# Patient Record
Sex: Female | Born: 1965 | Race: White | Hispanic: No | Marital: Married | Smoking: Current every day smoker
Health system: Southern US, Community
[De-identification: ages and names within clinical notes are randomized; demographics above are authoritative.]

## PROBLEM LIST (undated history)

## (undated) DIAGNOSIS — I10 Essential (primary) hypertension: Secondary | ICD-10-CM

## (undated) DIAGNOSIS — Z9109 Other allergy status, other than to drugs and biological substances: Secondary | ICD-10-CM

## (undated) DIAGNOSIS — Z8739 Personal history of other diseases of the musculoskeletal system and connective tissue: Secondary | ICD-10-CM

## (undated) DIAGNOSIS — S329XXA Fracture of unspecified parts of lumbosacral spine and pelvis, initial encounter for closed fracture: Secondary | ICD-10-CM

## (undated) DIAGNOSIS — N6002 Solitary cyst of left breast: Secondary | ICD-10-CM

## (undated) DIAGNOSIS — Z9289 Personal history of other medical treatment: Secondary | ICD-10-CM

## (undated) DIAGNOSIS — D649 Anemia, unspecified: Secondary | ICD-10-CM

## (undated) HISTORY — DX: Essential (primary) hypertension: I10

## (undated) HISTORY — DX: Fracture of unspecified parts of lumbosacral spine and pelvis, initial encounter for closed fracture: S32.9XXA

## (undated) HISTORY — PX: NECK SURGERY: SHX720

## (undated) HISTORY — PX: CERVIX LESION DESTRUCTION: SHX591

## (undated) HISTORY — DX: Personal history of other diseases of the musculoskeletal system and connective tissue: Z87.39

## (undated) HISTORY — DX: Solitary cyst of left breast: N60.02

## (undated) HISTORY — DX: Personal history of other medical treatment: Z92.89

## (undated) HISTORY — DX: Other allergy status, other than to drugs and biological substances: Z91.09

---

## 1986-02-06 DIAGNOSIS — S329XXA Fracture of unspecified parts of lumbosacral spine and pelvis, initial encounter for closed fracture: Secondary | ICD-10-CM

## 1986-02-06 HISTORY — PX: DIAGNOSTIC LAPAROSCOPY: SUR761

## 1986-02-06 HISTORY — DX: Fracture of unspecified parts of lumbosacral spine and pelvis, initial encounter for closed fracture: S32.9XXA

## 1991-02-07 HISTORY — PX: CHOLECYSTECTOMY: SHX55

## 2000-02-07 DIAGNOSIS — N6002 Solitary cyst of left breast: Secondary | ICD-10-CM

## 2000-02-07 HISTORY — DX: Solitary cyst of left breast: N60.02

## 2008-03-18 ENCOUNTER — Ambulatory Visit: Payer: Self-pay | Admitting: Internal Medicine

## 2008-06-03 ENCOUNTER — Ambulatory Visit: Payer: Self-pay | Admitting: Internal Medicine

## 2009-05-07 HISTORY — PX: FUNCTIONAL ENDOSCOPIC SINUS SURGERY: SUR616

## 2009-05-07 HISTORY — PX: NASAL SEPTUM SURGERY: SHX37

## 2009-05-13 ENCOUNTER — Ambulatory Visit: Payer: Self-pay | Admitting: Otolaryngology

## 2009-10-31 ENCOUNTER — Ambulatory Visit: Payer: Self-pay | Admitting: Family Medicine

## 2010-02-23 ENCOUNTER — Ambulatory Visit: Payer: Self-pay | Admitting: Family Medicine

## 2010-02-28 ENCOUNTER — Ambulatory Visit: Payer: Self-pay | Admitting: Internal Medicine

## 2010-03-01 ENCOUNTER — Ambulatory Visit: Payer: Self-pay | Admitting: Family Medicine

## 2010-04-07 ENCOUNTER — Ambulatory Visit: Payer: Self-pay

## 2011-12-16 LAB — URINALYSIS, COMPLETE
Bilirubin,UR: NEGATIVE
Blood: NEGATIVE
Glucose,UR: NEGATIVE mg/dL (ref 0–75)
Ketone: NEGATIVE
Leukocyte Esterase: NEGATIVE
Nitrite: NEGATIVE
Ph: 6 (ref 4.5–8.0)
Protein: NEGATIVE
RBC,UR: 1 /HPF (ref 0–5)
Specific Gravity: 1.003 (ref 1.003–1.030)
Squamous Epithelial: 2
WBC UR: NONE SEEN /HPF (ref 0–5)

## 2011-12-16 LAB — CBC
HCT: 43.9 % (ref 35.0–47.0)
HGB: 15.2 g/dL (ref 12.0–16.0)
MCH: 32.1 pg (ref 26.0–34.0)
MCHC: 34.7 g/dL (ref 32.0–36.0)
MCV: 93 fL (ref 80–100)
Platelet: 219 10*3/uL (ref 150–440)
RBC: 4.74 10*6/uL (ref 3.80–5.20)
RDW: 13.7 % (ref 11.5–14.5)
WBC: 10.5 10*3/uL (ref 3.6–11.0)

## 2011-12-16 LAB — COMPREHENSIVE METABOLIC PANEL
Albumin: 4.1 g/dL (ref 3.4–5.0)
Alkaline Phosphatase: 77 U/L (ref 50–136)
Anion Gap: 11 (ref 7–16)
BUN: 8 mg/dL (ref 7–18)
Bilirubin,Total: 0.2 mg/dL (ref 0.2–1.0)
Calcium, Total: 9 mg/dL (ref 8.5–10.1)
Chloride: 111 mmol/L — ABNORMAL HIGH (ref 98–107)
Co2: 22 mmol/L (ref 21–32)
Creatinine: 0.67 mg/dL (ref 0.60–1.30)
EGFR (African American): >60
EGFR (Non-African Amer.): >60
Glucose: 86 mg/dL (ref 65–99)
Osmolality: 284 (ref 275–301)
Potassium: 3.4 mmol/L — ABNORMAL LOW (ref 3.5–5.1)
SGOT(AST): 20 U/L (ref 15–37)
SGPT (ALT): 22 U/L (ref 12–78)
Sodium: 144 mmol/L (ref 136–145)
Total Protein: 7.1 g/dL (ref 6.4–8.2)

## 2011-12-16 LAB — LIPASE, BLOOD: Lipase: 410 U/L — ABNORMAL HIGH (ref 73–393)

## 2011-12-17 ENCOUNTER — Observation Stay: Payer: Self-pay | Admitting: Internal Medicine

## 2011-12-17 LAB — ETHANOL
Ethanol %: 0.085 % — ABNORMAL HIGH (ref 0.000–0.080)
Ethanol: 85 mg/dL

## 2011-12-17 LAB — LIPID PANEL
Cholesterol: 155 mg/dL (ref 0–200)
HDL Cholesterol: 28 mg/dL — ABNORMAL LOW (ref 40–60)
Ldl Cholesterol, Calc: 66 mg/dL (ref 0–100)
Triglycerides: 307 mg/dL — ABNORMAL HIGH (ref 0–200)
VLDL Cholesterol, Calc: 61 mg/dL — ABNORMAL HIGH (ref 5–40)

## 2011-12-17 LAB — TROPONIN I
Troponin-I: <0.02 ng/mL
Troponin-I: <0.02 ng/mL
Troponin-I: <0.02 ng/mL

## 2011-12-17 LAB — DRUG SCREEN, URINE

## 2011-12-17 LAB — CK TOTAL AND CKMB (NOT AT ARMC)
CK, Total: 94 U/L (ref 21–215)
CK, Total: 71 U/L (ref 21–215)
CK, Total: 61 U/L (ref 21–215)
CK-MB: 1.1 ng/mL (ref 0.5–3.6)
CK-MB: 1.1 ng/mL (ref 0.5–3.6)
CK-MB: 0.9 ng/mL (ref 0.5–3.6)

## 2011-12-18 LAB — URINE CULTURE

## 2011-12-22 LAB — CULTURE, BLOOD (SINGLE)

## 2013-02-07 ENCOUNTER — Ambulatory Visit: Payer: Self-pay | Admitting: Physician Assistant

## 2013-02-07 LAB — RAPID STREP-A WITH REFLX: Micro Text Report: NEGATIVE

## 2013-02-07 LAB — RAPID INFLUENZA A&B ANTIGENS

## 2013-02-10 LAB — BETA STREP CULTURE(ARMC)

## 2013-11-11 ENCOUNTER — Ambulatory Visit: Payer: Self-pay | Admitting: Emergency Medicine

## 2013-11-11 LAB — RAPID STREP-A WITH REFLX: Micro Text Report: NEGATIVE

## 2013-11-14 LAB — BETA STREP CULTURE(ARMC)

## 2014-05-26 NOTE — H&P (Signed)
Past Med/Surgical Hx:  Denies medical history:   Denies surgical history.:   ALLERGIES:  No Known Allergies:     Assessment/Admission Diagnosis 49 yo female with PMHx of tobacco abuse now with elevate lipase with epigastric and LLQ Pain  Abdominal pain - DDx includes pancreatitis, nephrolithasis, UTI - patient with elevated lipase (410) - states she is a social drinker (1-2 beer/wk), will still check EtOH level - check urine culture, blood culture, urine drug screen - check CT A\P - GI consult - pain management with morphine  - IVFs - supportive care - IV PPI  Tobacco Abuse: counselled pt on tobacco cessation  Cough - chronic, no increase sputum production - secondary to long history of tobacco use, will continue to monitor  Hypokalemia - mild - secondary to poor po intake - will replace and recheck  FULL CODE PMD - Dr. Gavin PottersGrandis ([facility])  Time spent evaluating patient = 45 minutes [id number]#[id number]   Electronic Signatures: Stephanie AcreMungal, Ranya Fiddler (MD)  (Signed [phone]-Nov-13 04:40)  Authored: PAST MEDICAL/SURGIAL HISTORY, ALLERGIES, HOME MEDICATIONS, ASSESSMENT AND PLAN   Last Updated: 10-Nov-13 04:40 by Stephanie AcreMungal, Irving Lubbers (MD)

## 2014-05-26 NOTE — Discharge Summary (Signed)
PATIENT NAME:  Nicole Carpenter, Nicole Carpenter MR#:  [id number] DATE OF BIRTH:  [birth year]  DATE OF ADMISSION:  12/17/2011 DATE OF DISCHARGE:  12/17/2011  PRIMARY CARE PHYSICIAN: Barry BrunnerGlenn Willett, MD  DISCHARGE DIAGNOSES:  1. Mild acute pancreatitis, possible alcohol-induced gastritis.  2. Alcohol abuse.  3. Tobacco abuse.   CONDITION: Stable.   HOME MEDICATIONS:  1. Multivitamin 1 tablet p.o. daily.  2. Protonix 40 mg p.o. daily.   DIET: Regular.   ACTIVITY: As tolerated.   FOLLOWUP CARE: Follow-up with PCP within 1 to 2 weeks. Smoking cessation and alcohol cessation - was counseled.  REASON FOR ADMISSION: Abdominal pain for one week.   HOSPITAL COURSE: The patient is a 49 year old Caucasian female with no past medical history, only with tobacco abuse, who presented to the ED with abdominal pain for one week which was intermittent, sharp. The patient was noted to have mild elevation of lipase at 410 and was admitted for acute pancreatitis. After admission the patient has been treated with n.p.o. and IV fluid support with PPI. CT scan of the abdomen and ultrasound of abdomen did not show any cholelithiasis or nephrolithiasis, no obvious obstruction. Chest x-ray showed no acute disease. CBC was normal. Glucose was 86, BUN 8, and creatinine 0.67. Potassium was 3.4, which was treated with supplement. After admission the patient had no abdominal pain, no nausea, no vomiting or diarrhea. Workup so far is negative. We started regular diet. If the patient can tolerate, the patient will be discharged to home today. I discussed the patient's discharge plan with the patient and case manager.   TIME SPENT: About 34 minutes. ____________________________ Shaune PollackQing Kolbi Tofte, MD qc:slb D: 12/17/2011 14:14:21 ET T: 12/18/2011 12:18:59 ET JOB#: [id number]  cc: Shaune PollackQing Naveh Rickles, MD, <Dictator> Jorje GuildGlenn R. Beckey DowningWillett, MD Shaune PollackQING Evey Mcmahan MD ELECTRONICALLY SIGNED 12/19/2011 16:37

## 2014-05-26 NOTE — H&P (Signed)
PATIENT NAME:  Nicole Carpenter, Blessin L MR#:  [id number] DATE OF BIRTH:  07/01/1965  DATE OF ADMISSION:  12/17/2011  ED REFERRING PHYSICIAN: Dr. Zenda AlpersWebster   PR[facility] C[facility]IC[facility]   ADMITTING PHYSICIAN: Dr. Dema SeverinMungal   CHIEF COMPLAINT: Abdominal pain x1 week, getting worse.   HISTORY OF PRESENT ILLNESS: This is a pleasant 49 year old Caucasian female with past medical history significant only for tobacco abuse who presented with the above chief complaint. The patient stated that last week she started having some abdominal pain in the epigastric and left lower quadrant region, more of a spasm-like, lasting a few seconds, however, as the week progressed the frequency of the pain continued to increase and become sharp and throbbing at times. It is not positional. It is not associated with anything. She denies any trauma, scorpion stings, bites, spider bites, or anything to that area. The patient stated she drinks 1 to 2 beers and she has never had pancreatitis in the past before. She does state that when she had her son 20 years ago she did have nephrolithiasis at that time but has not had it since. The patient thinks that the pain is similar to when she had nephrolithiasis but she is unsure. She was [facility] yesterday, was given some sort of muscle relaxer which she cannot remember the name of but thinks it's cyclobenzaprine. As stated, that may have helped with the pain mildly. She said when the pain comes on it's about 8 to 10, currently it's a 3 out of 10 right now. She denies any nausea, vomiting, fever, diarrhea, burning with urination, or trauma to the abdomen. Hospitalist services were consulted when her lipase was noted to be 410 and she was still having pain. The patient will be admitted for further inpatient work-up and management.   PAST MEDICAL HISTORY: Tobacco abuse.  MEDICATIONS: Cyclobenzaprine. The patient will bring an updated medication list.    PAST SURGICAL HISTORY:  1. Cholecystectomy 20 years ago. 2. Reconstruction of deviated septum 20 years ago.   ALLERGIES: Seasonal; no known drug allergies.   FAMILY HISTORY: Mother has history of hypertension.   SOCIAL HISTORY: Smoker, a pack a day for 30 years. Drinks occasionally one to two beers a week.   REVIEW OF SYSTEMS: CONSTITUTIONAL: Denies fever, fatigue, weakness. Positive abdominal pain. No weight loss, weight gain. No night sweats. EYES: No blurred vision, double vision, pain, or redness. ENT: No tinnitus, ear pain, hearing loss. RESPIRATORY: Chronic cough. No wheezing, hemoptysis, dyspnea, asthma, painful respirations, TB, or pneumonia. CARDIOVASCULAR: No chest pain, edema, arrhythmia, dyspnea on exertion. GI: No nausea, vomiting, diarrhea. Positive midepigastric and left lower quadrant pain. No melena, ulcers, GERD. GU: No dysuria, hematuria, or renal calculi. ENDOCRINE: No polyuria, nocturia, or thyroid problems. HEME/LYMPH: No anemia, easy bruising, bleeding, or swollen glands. INTEGUMENTARY: No acne, rash, lesions, or change in mole, hair or skin. MUSCULOSKELETAL: No pain in neck, back, shoulder, knee, hips, or arthritis. NEUROLOGIC: No numbness, weakness, dysarthria, tremor, vertigo, ataxia. PSYCH: No anxiety, insomnia, ADD, OCD, bipolar, depression, or nervousness.   PHYSICAL EXAMINATION:   VITAL SIGNS IN THE ED: Temperature 97.5, pulse 91, respirations 22, blood pressure 129/79, oxygen sat 98% on room air.   GENERAL APPEARANCE: Well developed, well nourished female laying in bed in no acute respiratory distress.   HEENT: Pupils equal, round, and reactive to light and accommodation. Extraocular movements intact. No scleral icterus. No difficulty hearing. TMs are intact. No pharyngeal erythema.  Mucous membranes are pale.   NECK: No thyroid enlargement or nodules. Neck is supple and nontender. No JVD or carotid bruits.   RESPIRATORY: Clear to auscultation bilaterally. No  rales, rhonchi, crackles, or diminished breath sounds or wheezing. No labored breathing.   CARDIOVASCULAR: Regular rate, regular rhythm. No murmurs. S1, S2 auscultated. PMI lateralization. No extremity edema. Good radial and dorsalis pedis pulses.   ABDOMEN: Soft. No masses palpated. Positive bowel sounds. Mild epigastric pain to moderate palpation. No guarding. No rebound tenderness. Moderate left lower quadrant pain to deep palpation.   MUSCULOSKELETAL: 5 out of 5 strength in bilateral upper and lower extremities.   SKIN: No rash is noted especially in the abdominal region.   SKIN: Warm and dry.   LYMPHATIC: No adenopathy noted in cervical, axilla, or supraclavicular regions.   NEURO: Cranial nerves II through XII intact. No other deficits noted.   PSYCH: Alert and oriented to time and place. Cooperative. Good judgment.   PERTINENT LABS: Sodium 144, potassium 3.4, chloride 111, bicarb 22, BUN 8, creatinine 0.67, glucose 86, AST 20, ALT 22, alkaline phosphatase 77, total bilirubin 0.2. First set of troponin is less than 0.02. CBC white cell count 10.5, hemoglobin 15.2, hematocrit 43.9, platelet count 219. Urinalysis essentially negative. Trace bacteria is the only thing noticed there.   EKG pending.   She did have an ultrasound of the abdomen by oral report is currently negative with no acute findings.   CT abdomen and pelvis is currently pending.   ASSESSMENT AND PLAN: This is a 49 year old female with past medical history of tobacco abuse now with elevated lipase, epigastric and lower quadrant pain.  1. Abdominal pain. Differential diagnoses include pancreatitis, nephrolithiasis, urinary tract infection. The patient does have an elevated lipase and does have some epigastric pain making the diagnosis of pancreatitis more likely, however, this will be a very mild course of pancreatitis if it is. She is a social drinker, 1 to 2 beers per week. Will still check an alcohol level. Check urine  culture, blood culture, and a urine drug screen. Will also check a CT abdomen and pelvis to rule out any other pathology that could be causing this. Will consult GI in the morning. Pain management with morphine 2 to 4 mg q.2 to 4 hours. also, make n.p.o. Start her on IV fluids, supportive care. Start on IV PPI.  2. Tobacco abuse. The patient was counseled on tobacco cessation and counseled on the complications of prolonged smoking given her history.  3. Cough. This is chronic. No increase in sputum production or shortness of breath secondary to prolonged history of tobacco abuse. Will continue to monitor the patient. Offered nicotine patch also.  4. Hypokalemia, is currently mild. Potassium is 3.4 secondary to poor p.o. intake and hydration. Will replace and recheck. Also give IV fluids.   CODE STATUS: The patient is a FULL CODE.   TIME SPENT DICTATING AND EVALUATING PATIENT: 45 minutes.   ____________________________ Stephanie Acre, MD vm:drc D: 12/17/2011 04:40:56 ET T: 12/17/2011 10:43:39 ET JOB#: [id number]  cc: Stephanie Acre, MD, <Dictator> Letitia Caul, MD Stephanie Acre MD ELECTRONICALLY SIGNED 12/19/2011 15:48

## 2014-06-09 LAB — HM PAP SMEAR

## 2014-07-07 LAB — HM MAMMOGRAPHY

## 2015-11-26 ENCOUNTER — Other Ambulatory Visit: Payer: Self-pay | Admitting: Family Medicine

## 2015-11-26 DIAGNOSIS — Z1231 Encounter for screening mammogram for malignant neoplasm of breast: Secondary | ICD-10-CM

## 2015-12-09 ENCOUNTER — Encounter: Payer: Self-pay | Admitting: Radiology

## 2015-12-09 ENCOUNTER — Ambulatory Visit
Admission: RE | Admit: 2015-12-09 | Discharge: 2015-12-09 | Disposition: A | Discharge status: Home / Self Care | Payer: BLUE CROSS/BLUE SHIELD | Source: Ambulatory Visit | Attending: Family Medicine | Admitting: Family Medicine

## 2015-12-09 DIAGNOSIS — Z1231 Encounter for screening mammogram for malignant neoplasm of breast: Secondary | ICD-10-CM | POA: Diagnosis present

## 2015-12-15 ENCOUNTER — Ambulatory Visit
Admission: EM | Admit: 2015-12-15 | Discharge: 2015-12-15 | Disposition: A | Discharge status: Home / Self Care | Payer: BLUE CROSS/BLUE SHIELD | Attending: Family Medicine | Admitting: Family Medicine

## 2015-12-15 DIAGNOSIS — J019 Acute sinusitis, unspecified: Secondary | ICD-10-CM

## 2015-12-15 DIAGNOSIS — J029 Acute pharyngitis, unspecified: Secondary | ICD-10-CM | POA: Diagnosis not present

## 2015-12-15 LAB — RAPID STREP SCREEN (MED CTR MEBANE ONLY): Streptococcus, Group A Screen (Direct): NEGATIVE

## 2015-12-15 MED ORDER — AMOXICILLIN-POT CLAVULANATE 875-125 MG PO TABS: 1.0000 | ORAL_TABLET | Freq: Two times a day (BID) | ORAL | 0 refills | Status: DC

## 2015-12-15 MED ORDER — CETIRIZINE-PSEUDOEPHEDRINE ER 5-120 MG PO TB12: 1.0000 | ORAL_TABLET | Freq: Two times a day (BID) | ORAL | 0 refills | Status: AC

## 2015-12-15 MED ORDER — FLUTICASONE PROPIONATE 50 MCG/ACT NA SUSP: 2.0000 | Freq: Every day | NASAL | 0 refills | Status: AC

## 2015-12-15 NOTE — ED Provider Notes (Signed)
[facility]    CSN: [id number] Arrival date & time: 12/15/15  [id number]     History   Chief Complaint Chief Complaint  Patient presents with  . Sore Throat    HPI Nicole Carpenter is a 50 y.o. female.   Patient is a 50 year old white female who presents with nasal congestion coughing and nasal pressure for over a week. She's aching since had a sore throat and does flex he can shake this. She states about once or twice a year she'll get this. The throat stasis sore which thinks is coming from postnasal drainage. Unfortunately she does smoke. She's had a cholecystectomy nasal septum surgery and neck surgery. History of breast cancer in paternal father's side. No known drug allergies.   The history is provided by the patient. No language interpreter was used.  Sore Throat  This is a new problem. The problem occurs constantly. The problem has been gradually worsening. Pertinent negatives include no chest pain, no abdominal pain, no headaches and no shortness of breath. Nothing relieves the symptoms. Treatments tried: antihistamines.    History reviewed. No pertinent past medical history.  There are no active problems to display for this patient.   Past Surgical History:  Procedure Laterality Date  . CHOLECYSTECTOMY    . NASAL SEPTUM SURGERY    . NECK SURGERY      OB History    No data available       Home Medications    Prior to Admission medications   Not on File    Family History Family History  Problem Relation Age of Onset  . Breast cancer Paternal Aunt     Social History Social History  Substance Use Topics  . Smoking status: Current Every Day Smoker    Packs/day: 1.00    Types: Cigarettes  . Smokeless tobacco: Never Used  . Alcohol use Yes     Comment: occasionally     Allergies   Patient has no known allergies.   Review of Systems Review of Systems  Respiratory: Negative for shortness of breath.   Cardiovascular: Negative for chest  pain.  Gastrointestinal: Negative for abdominal pain.  Neurological: Negative for headaches.  All other systems reviewed and are negative.    Physical Exam Triage Vital Signs ED Triage Vitals  Enc Vitals Group     BP 12/15/15 0841 (!) 136/91     Pulse Rate 12/15/15 0841 99     Resp 12/15/15 0841 16     Temp 12/15/15 0841 98.2 F (36.8 C)     Temp Source 12/15/15 0841 Oral     SpO2 12/15/15 0841 98 %     Weight 12/15/15 0840 143 lb (64.9 kg)     Height 12/15/15 0840 5\' 5"  (1.651 m)     Head Circumference --      Peak Flow --      Pain Score 12/15/15 0843 5     Pain Loc --      Pain Edu? --      Excl. in GC? --    No data found.   Updated Vital Signs BP (!) 136/91 (BP Location: Left Arm)   Pulse 99   Temp 98.2 F (36.8 C) (Oral)   Resp 16   Ht 5\' 5"  (1.651 m)   Wt 143 lb (64.9 kg)   LMP 12/09/2015   SpO2 98%   BMI 23.80 kg/m   Visual Acuity Right Eye Distance:   Left Eye Distance:  Bilateral Distance:    Right Eye Near:   Left Eye Near:    Bilateral Near:     Physical Exam  Constitutional: She is oriented to person, place, and time. She appears well-developed and well-nourished.  HENT:  Head: Normocephalic.  Nose: Mucosal edema and rhinorrhea present.  Mouth/Throat: Uvula is midline, oropharynx is clear and moist and mucous membranes are normal. No oral lesions. No dental caries.  Eyes: Pupils are equal, round, and reactive to light.  Neck: Normal range of motion. Neck supple.  Cardiovascular: Normal rate.   Pulmonary/Chest: Effort normal.  Musculoskeletal: Normal range of motion.  Lymphadenopathy:    She has cervical adenopathy.  Neurological: She is alert and oriented to person, place, and time.  Skin: Skin is warm.  Psychiatric: She has a normal mood and affect.  Vitals reviewed.    UC Treatments / Results  Labs (all labs ordered are listed, but only abnormal results are displayed) Labs Reviewed  RAPID STREP SCREEN (NOT AT [facility])     EKG  EKG Interpretation None       Radiology No results found.  Procedures Procedures (including critical care time)  Medications Ordered in UC Medications - No data to display   Initial Impression / Assessment and Plan / UC Course  I have reviewed the triage vital signs and the nursing notes.  Pertinent labs & imaging results that were available during my care of the patient were reviewed by me and considered in my medical decision making (see chart for details).  Clinical Course    If strep test is negative as expected we'll place on Augmentin 8 08/10/2008 days to treat a sinus infection. Switch from Zyrtec to Zyrtec-D twice a day and Flonase nasal spray 2 puffs each nostril daily. Recommend she stop smoking and follow-up PCP if not better in a week. She declined work note  Results for orders placed or performed during the hospital encounter of 12/15/15  Rapid strep screen  Result Value Ref Range   Streptococcus, Group A Screen (Direct) NEGATIVE NEGATIVE   Final Clinical Impressions(s) / UC Diagnoses   Final diagnoses:  Acute non-recurrent sinusitis, unspecified location  Pharyngitis, unspecified etiology    New Prescriptions New Prescriptions   No medications on file    Note: This dictation was prepared with Dragon dictation along with smaller phrase technology. Any transcriptional errors that result from this process are unintentional.   Hassan RowanEugene Kaleo Condrey, MD 12/15/15 [id number]

## 2015-12-15 NOTE — ED Triage Notes (Signed)
Patient complains of sore throat, drainage, chills, sinus pain and pressure x 1 week.

## 2015-12-16 ENCOUNTER — Inpatient Hospital Stay
Admission: RE | Admit: 2015-12-16 | Discharge: 2015-12-16 | Disposition: A | Discharge status: Home / Self Care | Payer: Self-pay | Source: Ambulatory Visit | Attending: *Deleted | Admitting: *Deleted

## 2015-12-16 ENCOUNTER — Other Ambulatory Visit: Payer: Self-pay | Admitting: *Deleted

## 2015-12-16 DIAGNOSIS — Z9289 Personal history of other medical treatment: Secondary | ICD-10-CM

## 2015-12-18 LAB — CULTURE, GROUP A STREP (THRC)

## 2015-12-20 ENCOUNTER — Telehealth: Payer: Self-pay | Admitting: *Deleted

## 2015-12-20 NOTE — Telephone Encounter (Signed)
Called patient, no answer, left message reporting a negative strep culture result. Advised patient to follow up with PCP or [facility] if symptoms persist. 

## 2016-08-31 ENCOUNTER — Other Ambulatory Visit: Payer: Self-pay | Admitting: Family Medicine

## 2016-08-31 DIAGNOSIS — N926 Irregular menstruation, unspecified: Secondary | ICD-10-CM

## 2016-09-04 ENCOUNTER — Ambulatory Visit
Admission: RE | Admit: 2016-09-04 | Discharge: 2016-09-04 | Disposition: A | Discharge status: Home / Self Care | Payer: BLUE CROSS/BLUE SHIELD | Source: Ambulatory Visit | Attending: Family Medicine | Admitting: Family Medicine

## 2016-09-04 DIAGNOSIS — N926 Irregular menstruation, unspecified: Secondary | ICD-10-CM | POA: Diagnosis not present

## 2016-09-04 DIAGNOSIS — R938 Abnormal findings on diagnostic imaging of other specified body structures: Secondary | ICD-10-CM | POA: Insufficient documentation

## 2016-09-07 ENCOUNTER — Ambulatory Visit (INDEPENDENT_AMBULATORY_CARE_PROVIDER_SITE_OTHER): Payer: BLUE CROSS/BLUE SHIELD | Admitting: Obstetrics and Gynecology

## 2016-09-07 ENCOUNTER — Encounter: Payer: Self-pay | Admitting: Obstetrics and Gynecology

## 2016-09-07 VITALS — BP 130/90 | HR 99 | Ht 65.0 in | Wt 143.0 lb

## 2016-09-07 DIAGNOSIS — N938 Other specified abnormal uterine and vaginal bleeding: Secondary | ICD-10-CM

## 2016-09-07 NOTE — Progress Notes (Signed)
Chief Complaint  Patient presents with  . Menstrual Problem    HPI:      Ms. Nicole Carpenter is a 51 y.o. [id number] who LMP was Patient's last menstrual period was 08/28/2016., presents today for  DUB sx for the past 8-9 months. Pt referred by her PCP. Menses used to be monthly, lasting 5 days. She has bleeding Q2-4 wks now, lasting 4-5 days, medium flow with small clots. She does have slightly worsening dysmen, improved with tylenol.  She denies any vasomotor sx.  She had neg thryoid labs with PCP, per pt report.  07/07/14 neg pap/Neg HPV DNA; s/p cyrotherapy for dysplasia about 2000  09/04/16 GYN u/s with PCP-->EM=6.1 MM; NO FF, BILAT OVAR WNL; 8 MM FOCI IN [place] [facility]    Past Medical History:  Diagnosis Date  . Breast cyst, left 2002   LEFT BREAST CYST RESOLVED  . Environmental allergies   . Fractured pelvis (HCC) 1988   [facility]  . History of degenerative disc disease   . History of mammogram 06/27/2013; 07/07/14   BIRADS 1; NEG  . History of Papanicolaou smear of cervix 03/29/11; 06/09/14   -/-; -/-  . Hypertension     Past Surgical History:  Procedure Laterality Date  . CERVIX LESION DESTRUCTION  1989/90  . CHOLECYSTECTOMY  1993  . DIAGNOSTIC LAPAROSCOPY  1988  . FUNCTIONAL ENDOSCOPIC SINUS SURGERY  05/2009  . NASAL SEPTUM SURGERY  05/2009  . NECK SURGERY      Family History  Problem Relation Age of Onset  . Breast cancer Paternal Aunt [id number]  . Hypertension Mother        BORDERLINE 2006  . Cancer Maternal Aunt [id number]       UTERINE    Social History   Social History  . Marital status: Married    Spouse name: N/A  . Number of children: 2  . Years of education: 14   Occupational History  . [employer] ADMIN MANAGER    Social History Main Topics  . Smoking status: Current Every Day Smoker    Packs/day: 1.00    Years: 1.00    Types: Cigarettes  . Smokeless tobacco: Never Used  . Alcohol use 1.2 oz/week    2 Cans of beer per week     Comment: 2/WK  . Drug  use: No  . Sexual activity: Yes    Birth control/ protection: Surgical     Comment: VASECTOMY   Other Topics Concern  . Not on file   Social History Narrative  . No narrative on file     Current Outpatient Prescriptions:  .  cetirizine-pseudoephedrine (ZYRTEC-D) 5-120 MG tablet, Take 1 tablet by mouth 2 (two) times daily., Disp: 30 tablet, Rfl: 0 .  fluticasone (FLONASE) 50 MCG/ACT nasal spray, Place 2 sprays into both nostrils daily., Disp: 16 g, Rfl: 0 .  amoxicillin-clavulanate (AUGMENTIN) 875-125 MG tablet, Take 1 tablet by mouth 2 (two) times daily. (Patient not taking: Reported on 09/07/2016), Disp: 20 tablet, Rfl: 0 .  metoprolol succinate (TOPROL-XL) 25 MG 24 hr tablet, , Disp: , Rfl:    ROS:  Review of Systems  Constitutional: Negative for fever.  Gastrointestinal: Negative for blood in stool, constipation, diarrhea, nausea and vomiting.  Genitourinary: Positive for menstrual problem. Negative for dyspareunia, dysuria, flank pain, frequency, hematuria, urgency, vaginal bleeding, vaginal discharge and vaginal pain.  Musculoskeletal: Negative for back pain.  Skin: Negative for rash.     OBJECTIVE:   Vitals:  BP 130/90  Pulse 99   Ht 5\' 5"  (1.651 m)   Wt 143 lb (64.9 kg)   LMP 08/28/2016   BMI 23.80 kg/m   Physical Exam  Constitutional: She is oriented to person, place, and time and well-developed, well-nourished, and in no distress. Vital signs are normal.  Genitourinary: Vagina normal, uterus normal, cervix normal, right adnexa normal, left adnexa normal and vulva normal. Uterus is not enlarged. Cervix exhibits no motion tenderness and no tenderness. Right adnexum displays no mass and no tenderness. Left adnexum displays no mass and no tenderness. Vulva exhibits no erythema, no exudate, no lesion, no rash and no tenderness. Vagina exhibits no lesion.  Neurological: She is oriented to person, place, and time.  Vitals reviewed.   Assessment/Plan: DUB  (dysfunctional uterine bleeding) - Sx for 8-9 months. Neg GYN u/s. EMB today. Will f/u with results. Discussed BC, IUD, ablation, watch and wait if results neg. - Plan: Pathology    Endometrial Biopsy After discussion with the patient regarding her abnormal uterine bleeding I recommended that she proceed with an endometrial biopsy for further diagnosis. The risks, benefits, alternatives, and indications for an endometrial biopsy were discussed with the patient in detail. She understood the risks including infection, bleeding, cervical laceration and uterine perforation.  Verbal consent was obtained.   PROCEDURE NOTE:  Pipelle endometrial biopsy was performed using aseptic technique with iodine preparation.  The uterus was sounded to a length of 8.0 cm.  Adequate sampling was obtained with minimal blood loss.  The patient tolerated the procedure well.  Disposition will be pending pathology.    Return in about 2 months (around 11/07/2016) for annual.  Alicia B. Copland, PA-C [facility], [facility] 09/07/2016  11:23 AM

## 2016-09-11 LAB — PATHOLOGY

## 2016-09-13 ENCOUNTER — Telehealth: Payer: Self-pay | Admitting: Obstetrics and Gynecology

## 2016-09-13 NOTE — Telephone Encounter (Signed)
LM for pt with neg EMB results. Most likely perimenopausal DUB sx. Can follow expectantly vs prog only methods/IUD/ablatioin. Pt to f/u at annual in 2 months/sooner prn.

## 2016-11-23 ENCOUNTER — Other Ambulatory Visit: Payer: Self-pay | Admitting: Family Medicine

## 2016-11-23 DIAGNOSIS — Z1231 Encounter for screening mammogram for malignant neoplasm of breast: Secondary | ICD-10-CM

## 2016-11-29 ENCOUNTER — Ambulatory Visit: Payer: BLUE CROSS/BLUE SHIELD | Admitting: Obstetrics and Gynecology

## 2016-12-19 ENCOUNTER — Encounter: Payer: Self-pay | Admitting: Radiology

## 2016-12-19 ENCOUNTER — Ambulatory Visit
Admission: RE | Admit: 2016-12-19 | Discharge: 2016-12-19 | Disposition: A | Discharge status: Home / Self Care | Payer: BLUE CROSS/BLUE SHIELD | Source: Ambulatory Visit | Attending: Family Medicine | Admitting: Family Medicine

## 2016-12-19 DIAGNOSIS — Z1231 Encounter for screening mammogram for malignant neoplasm of breast: Secondary | ICD-10-CM | POA: Diagnosis present

## 2017-11-19 ENCOUNTER — Other Ambulatory Visit: Payer: Self-pay | Admitting: Family Medicine

## 2017-11-19 DIAGNOSIS — Z1231 Encounter for screening mammogram for malignant neoplasm of breast: Secondary | ICD-10-CM

## 2017-12-24 ENCOUNTER — Ambulatory Visit
Admission: RE | Admit: 2017-12-24 | Discharge: 2017-12-24 | Disposition: A | Discharge status: Home / Self Care | Payer: BLUE CROSS/BLUE SHIELD | Source: Ambulatory Visit | Attending: Family Medicine | Admitting: Family Medicine

## 2017-12-24 DIAGNOSIS — Z1231 Encounter for screening mammogram for malignant neoplasm of breast: Secondary | ICD-10-CM | POA: Insufficient documentation

## 2018-10-30 ENCOUNTER — Emergency Department: Payer: BLUE CROSS/BLUE SHIELD

## 2018-10-30 ENCOUNTER — Other Ambulatory Visit: Payer: Self-pay

## 2018-10-30 ENCOUNTER — Observation Stay
Admission: EM | Admit: 2018-10-30 | Discharge: 2018-10-31 | Disposition: A | Discharge status: Home / Self Care | Payer: BLUE CROSS/BLUE SHIELD | Attending: Internal Medicine | Admitting: Internal Medicine

## 2018-10-30 ENCOUNTER — Encounter: Payer: Self-pay | Admitting: Emergency Medicine

## 2018-10-30 ENCOUNTER — Observation Stay
Admit: 2018-10-30 | Discharge: 2018-10-30 | Disposition: A | Discharge status: Home / Self Care | Payer: BLUE CROSS/BLUE SHIELD | Attending: Internal Medicine | Admitting: Internal Medicine

## 2018-10-30 DIAGNOSIS — R7989 Other specified abnormal findings of blood chemistry: Secondary | ICD-10-CM | POA: Diagnosis present

## 2018-10-30 DIAGNOSIS — I10 Essential (primary) hypertension: Secondary | ICD-10-CM | POA: Insufficient documentation

## 2018-10-30 DIAGNOSIS — I209 Angina pectoris, unspecified: Principal | ICD-10-CM

## 2018-10-30 DIAGNOSIS — Z79899 Other long term (current) drug therapy: Secondary | ICD-10-CM | POA: Diagnosis not present

## 2018-10-30 DIAGNOSIS — Z20828 Contact with and (suspected) exposure to other viral communicable diseases: Secondary | ICD-10-CM | POA: Insufficient documentation

## 2018-10-30 DIAGNOSIS — K219 Gastro-esophageal reflux disease without esophagitis: Secondary | ICD-10-CM | POA: Diagnosis not present

## 2018-10-30 DIAGNOSIS — F17210 Nicotine dependence, cigarettes, uncomplicated: Secondary | ICD-10-CM | POA: Diagnosis not present

## 2018-10-30 DIAGNOSIS — R079 Chest pain, unspecified: Secondary | ICD-10-CM | POA: Diagnosis present

## 2018-10-30 DIAGNOSIS — J45909 Unspecified asthma, uncomplicated: Secondary | ICD-10-CM | POA: Insufficient documentation

## 2018-10-30 DIAGNOSIS — R778 Other specified abnormalities of plasma proteins: Secondary | ICD-10-CM

## 2018-10-30 LAB — BASIC METABOLIC PANEL
Anion gap: 11 (ref 5–15)
BUN: 6 mg/dL (ref 6–20)
CO2: 27 mmol/L (ref 22–32)
Calcium: 10.4 mg/dL — ABNORMAL HIGH (ref 8.9–10.3)
Chloride: 105 mmol/L (ref 98–111)
Creatinine, Ser: 0.63 mg/dL (ref 0.44–1.00)
GFR calc Af Amer: >60 mL/min (ref >60)
GFR calc non Af Amer: >60 mL/min (ref >60)
Glucose, Bld: 97 mg/dL (ref 70–99)
Potassium: 4.3 mmol/L (ref 3.5–5.1)
Sodium: 143 mmol/L (ref 135–145)

## 2018-10-30 LAB — SARS CORONAVIRUS 2 (TAT 6-24 HRS): SARS Coronavirus 2: NEGATIVE

## 2018-10-30 LAB — CBC
HCT: 45.4 % (ref 36.0–46.0)
Hemoglobin: 14.6 g/dL (ref 12.0–15.0)
MCH: 29.1 pg (ref 26.0–34.0)
MCHC: 32.2 g/dL (ref 30.0–36.0)
MCV: 90.6 fL (ref 80.0–100.0)
Platelets: 297 10*3/uL (ref 150–400)
RBC: 5.01 MIL/uL (ref 3.87–5.11)
RDW: 14.5 % (ref 11.5–15.5)
WBC: 8.5 10*3/uL (ref 4.0–10.5)
nRBC: 0 % (ref 0.0–0.2)

## 2018-10-30 LAB — TROPONIN I (HIGH SENSITIVITY)
Troponin I (High Sensitivity): 21 ng/L — ABNORMAL HIGH (ref <18)
Troponin I (High Sensitivity): 37 ng/L — ABNORMAL HIGH (ref <18)
Troponin I (High Sensitivity): 6 ng/L (ref <18)

## 2018-10-30 MED ORDER — ENOXAPARIN SODIUM 40 MG/0.4ML ~~LOC~~ SOLN
40.0000 mg | SUBCUTANEOUS | Status: DC
  Filled 2018-10-30: qty 0.4

## 2018-10-30 MED ORDER — DOCUSATE SODIUM 100 MG PO CAPS: 100.0000 mg | ORAL_CAPSULE | Freq: Two times a day (BID) | ORAL | Status: DC | PRN

## 2018-10-30 MED ORDER — OMEPRAZOLE MAGNESIUM 20 MG PO TBEC: 20.0000 mg | DELAYED_RELEASE_TABLET | Freq: Every day | ORAL | Status: DC

## 2018-10-30 MED ORDER — PANTOPRAZOLE SODIUM 40 MG PO TBEC
40.0000 mg | DELAYED_RELEASE_TABLET | Freq: Every day | ORAL | Status: DC
  Administered 2018-10-31: 40 mg via ORAL
  Filled 2018-10-30: qty 1

## 2018-10-30 MED ORDER — FLUTICASONE PROPIONATE 50 MCG/ACT NA SUSP
2.0000 | Freq: Every day | NASAL | Status: DC
  Administered 2018-10-31: 2 via NASAL
  Filled 2018-10-30: qty 16

## 2018-10-30 MED ORDER — METOPROLOL SUCCINATE ER 25 MG PO TB24
12.5000 mg | ORAL_TABLET | Freq: Every day | ORAL | Status: DC
  Administered 2018-10-31: 13:00:00 12.5 mg via ORAL
  Filled 2018-10-30: qty 1

## 2018-10-30 NOTE — H&P (Signed)
[employer] - [facility]   PATIENT NAME: Nicole Carpenter    MR#:  [id number]  DATE OF BIRTH:  04/08/1965  DATE OF ADMISSION:  10/30/2018  PRIMARY CARE PHYSICIAN: Rolm Gala, MD   REQUESTING/REFERRING PHYSICIAN: [email or web address]  CHIEF COMPLAINT:   Chief Complaint  Patient presents with  . Chest Pain    HISTORY OF PRESENT ILLNESS: Nicole Carpenter  is a 53 y.o. female with a known history of degenerative disc disease, hypertension, cholecystectomy-had intermittent episodes of chest discomfort in the past.  Today morning she developed substernal chest pain which was pressure-like in nature was going all across her chest but not to her arms.  It lasted around 2 hours and was not associated with any fever palpitation or cough.  Patient troponin was noted slightly elevated and so ER physician suggested to monitor on hospitalist service and call cardiologist for further management.  PAST MEDICAL HISTORY:   Past Medical History:  Diagnosis Date  . Breast cyst, left 2002   LEFT BREAST CYST RESOLVED  . Environmental allergies   . Fractured pelvis (HCC) 1988   [facility]  . History of degenerative disc disease   . History of mammogram 06/27/2013; 07/07/14   BIRADS 1; NEG  . History of Papanicolaou smear of cervix 03/29/11; 06/09/14   -/-; -/-  . Hypertension     PAST SURGICAL HISTORY:  Past Surgical History:  Procedure Laterality Date  . CERVIX LESION DESTRUCTION  1989/90  . CHOLECYSTECTOMY  1993  . DIAGNOSTIC LAPAROSCOPY  1988  . FUNCTIONAL ENDOSCOPIC SINUS SURGERY  05/2009  . NASAL SEPTUM SURGERY  05/2009  . NECK SURGERY      SOCIAL HISTORY:  Social History   Tobacco Use  . Smoking status: Current Every Day Smoker    Packs/day: 1.00    Years: 1.00    Pack years: 1.00    Types: Cigarettes  . Smokeless tobacco: Never Used  Substance Use Topics  . Alcohol use: Yes    Alcohol/week: 2.0 standard drinks    Types: 2 Cans of beer per week    FAMILY HISTORY:   Family History  Problem Relation Age of Onset  . Breast cancer Paternal Aunt 28  . Hypertension Mother        BORDERLINE 2006  . Cancer Maternal Aunt 70       UTERINE  . Breast cancer Cousin        mat cousin  . Breast cancer Sister 9    DRUG ALLERGIES: No Known Allergies  REVIEW OF SYSTEMS:   CONSTITUTIONAL: No fever, fatigue or weakness.  EYES: No blurred or double vision.  EARS, NOSE, AND THROAT: No tinnitus or ear pain.  RESPIRATORY: No cough, shortness of breath, wheezing or hemoptysis.  CARDIOVASCULAR: have chest pain, no orthopnea, edema.  GASTROINTESTINAL: No nausea, vomiting, diarrhea or abdominal pain.  GENITOURINARY: No dysuria, hematuria.  ENDOCRINE: No polyuria, nocturia,  HEMATOLOGY: No anemia, easy bruising or bleeding SKIN: No rash or lesion. MUSCULOSKELETAL: No joint pain or arthritis.   NEUROLOGIC: No tingling, numbness, weakness.  PSYCHIATRY: No anxiety or depression.   MEDICATIONS AT HOME:  Prior to Admission medications   Medication Sig Start Date End Date Taking? Authorizing Provider  metoprolol succinate (TOPROL-XL) 25 MG 24 hr tablet Take 12.5 mg by mouth daily. 07/24/18  Yes [provider]  omeprazole (PRILOSEC OTC) 20 MG tablet Take 20 mg by mouth daily.   Yes [provider]  amoxicillin-clavulanate (AUGMENTIN) 875-125 MG tablet Take 1  tablet by mouth 2 (two) times daily. Patient not taking: Reported on 09/07/2016 12/15/15   Hassan Rowan, MD  cetirizine-pseudoephedrine (ZYRTEC-D) 5-120 MG tablet Take 1 tablet by mouth 2 (two) times daily. 12/15/15   Hassan Rowan, MD  fluticasone (FLONASE) 50 MCG/ACT nasal spray Place 2 sprays into both nostrils daily. 12/15/15   Hassan Rowan, MD      PHYSICAL EXAMINATION:   VITAL SIGNS: Blood pressure 113/74, pulse 85, temperature 98.7 F (37.1 C), temperature source Oral, resp. rate 16, height 5\' 5"  (1.651 m), weight 66.1 kg, SpO2 95 %.  GENERAL:  53 y.o.-year-old patient lying in the bed with  no acute distress.  EYES: Pupils equal, round, reactive to light and accommodation. No scleral icterus. Extraocular muscles intact.  HEENT: Head atraumatic, normocephalic. Oropharynx and nasopharynx clear.  NECK:  Supple, no jugular venous distention. No thyroid enlargement, no tenderness.  LUNGS: Normal breath sounds bilaterally, no wheezing, rales,rhonchi or crepitation. No use of accessory muscles of respiration.  CARDIOVASCULAR: S1, S2 normal. No murmurs, rubs, or gallops.  ABDOMEN: Soft, nontender, nondistended. Bowel sounds present. No organomegaly or mass.  EXTREMITIES: No pedal edema, cyanosis, or clubbing.  NEUROLOGIC: Cranial nerves II through XII are intact. Muscle strength 5/5 in all extremities. Sensation intact. Gait not checked.  PSYCHIATRIC: The patient is alert and oriented x 3.  SKIN: No obvious rash, lesion, or ulcer.   LABORATORY PANEL:   CBC Recent Labs  Lab 10/30/18 1115  WBC 8.5  HGB 14.6  HCT 45.4  PLT 297  MCV 90.6  MCH 29.1  MCHC 32.2  RDW 14.5   ------------------------------------------------------------------------------------------------------------------  Chemistries  Recent Labs  Lab 10/30/18 1115  NA 143  K 4.3  CL 105  CO2 27  GLUCOSE 97  BUN 6  CREATININE 0.63  CALCIUM 10.4*   ------------------------------------------------------------------------------------------------------------------ estimated creatinine clearance is 73.2 mL/min (by C-G formula based on SCr of 0.63 mg/dL). ------------------------------------------------------------------------------------------------------------------ No results for input(s): TSH, T4TOTAL, T3FREE, THYROIDAB in the last 72 hours.  Invalid input(s): FREET3   Coagulation profile No results for input(s): INR, PROTIME in the last 168 hours. ------------------------------------------------------------------------------------------------------------------- No results for input(s): DDIMER in the  last 72 hours. -------------------------------------------------------------------------------------------------------------------  Cardiac Enzymes No results for input(s): CKMB, TROPONINI, MYOGLOBIN in the last 168 hours.  Invalid input(s): CK ------------------------------------------------------------------------------------------------------------------ Invalid input(s): POCBNP  ---------------------------------------------------------------------------------------------------------------  Urinalysis    Component Value Date/Time   COLORURINE Straw 12/16/2011 2118   APPEARANCEUR Clear 12/16/2011 2118   LABSPEC 1.003 12/16/2011 2118   PHURINE 6.0 12/16/2011 2118   GLUCOSEU Negative 12/16/2011 2118   HGBUR Negative 12/16/2011 2118   BILIRUBINUR Negative 12/16/2011 2118   KETONESUR Negative 12/16/2011 2118   PROTEINUR Negative 12/16/2011 2118   NITRITE Negative 12/16/2011 2118   LEUKOCYTESUR Negative 12/16/2011 2118     RADIOLOGY: Dg Chest 2 View  Result Date: 10/30/2018 CLINICAL DATA:  Chest pain EXAM: CHEST - 2 VIEW COMPARISON:  12/17/2011 FINDINGS: Mild pulmonary hyperinflation. Lungs well aerated and clear. Heart size and vascularity normal. No pleural fluid identified. IMPRESSION: Mild pulmonary hyperinflation without acute cardiopulmonary abnormality. Electronically Signed   By: Marlan Palau M.D.   On: 10/30/2018 11:32    EKG: Orders placed or performed during the hospital encounter of 10/30/18  . EKG 12-Lead  . EKG 12-Lead  . ED EKG  . ED EKG    IMPRESSION AND PLAN:  *Anginal chest pain Pain was resolved when I saw the patient. We will keep on telemetry monitoring and follow serial troponin. All the records  are reviewed and case discussed with ED provider. Management plans discussed with the patient, family and they are in agreement. Follow 2D echocardiogram. Cardiologist made aware about patient's admission.  *Hypertension Continue  metoprolol.  *Active smoking Counseled to quit smoking for 4 minutes for nicotine patch.  CODE STATUS:    Code Status Orders  (From admission, onward)         Start     Ordered   10/30/18 1441  Full code  Continuous     10/30/18 1440        Code Status History    This patient has a current code status but no historical code status.   Advance Care Planning Activity    Advance Directive Documentation     Most Recent Value  Type of Advance Directive  Healthcare Power of Attorney, Living will  Pre-existing out of facility DNR order (yellow form or pink MOST form)  -  "MOST" Form in Place?  -       TOTAL TIME TAKING CARE OF THIS PATIENT: 45* minutes.    Vaughan Basta M.D on 10/30/2018   Between 7am to 6pm - Pager - [phone]  After 6pm go to [email or web address] - password [email or web address] [facility]  Office  [phone]  CC: Primary care physician; Hortencia Pilar, MD   Note: This dictation was prepared with Dragon dictation along with smaller phrase technology. Any transcriptional errors that result from this process are unintentional.

## 2018-10-30 NOTE — Progress Notes (Signed)
Family Meeting Note  Advance Directive: Yes   today a meeting took place with the patient.   The following clinical team members were present during this meeting: MD.  The following were discussed:Patient's diagnosis: Chest pain, hypertension, active smoking, Patient's progosis: Unable to determine.  Additional follow-up to be provided: Full code.  Time spent during discussion: 16 minutes.  Vaughan Basta, MD

## 2018-10-30 NOTE — ED Triage Notes (Signed)
Pt in via POV, reports sudden onset chest tightness with nausea.  Vitals WDL, NAD noted at this time.

## 2018-10-30 NOTE — Progress Notes (Signed)
*  PRELIMINARY RESULTS* Echocardiogram 2D Echocardiogram has been performed.  Nicole Carpenter S Suhayb Anzalone 10/30/2018, 8:16 PM 

## 2018-10-30 NOTE — ED Provider Notes (Signed)
[facility] Emergency Department Provider Note   ____________________________________________    I have reviewed the triage vital signs and the nursing notes.   HISTORY  Chief Complaint Chest Pain     HPI Nicole Carpenter is a 53 y.o. female who presents with complaints of chest pain.  Patient reports that approximately 930 this morning she was walking on the stairs and developed pressure-like/tightness in her chest.  She then started feeling nauseated and had some radiation to her shoulders bilaterally.  She reports this lasted approximately 2 hours.  She reports she is feeling better now.  No history of the same in the past.  Does have a history of hypertension, she does smoke cigarettes.  Denies shortness of breath or pleurisy.  No fevers or chills.  Not take anything for this  Past Medical History:  Diagnosis Date  . Breast cyst, left 2002   LEFT BREAST CYST RESOLVED  . Environmental allergies   . Fractured pelvis (HCC) 1988   [facility]  . History of degenerative disc disease   . History of mammogram 06/27/2013; 07/07/14   BIRADS 1; NEG  . History of Papanicolaou smear of cervix 03/29/11; 06/09/14   -/-; -/-  . Hypertension     There are no active problems to display for this patient.   Past Surgical History:  Procedure Laterality Date  . CERVIX LESION DESTRUCTION  1989/90  . CHOLECYSTECTOMY  1993  . DIAGNOSTIC LAPAROSCOPY  1988  . FUNCTIONAL ENDOSCOPIC SINUS SURGERY  05/2009  . NASAL SEPTUM SURGERY  05/2009  . NECK SURGERY      Prior to Admission medications   Medication Sig Start Date End Date Taking? Authorizing Provider  amoxicillin-clavulanate (AUGMENTIN) 875-125 MG tablet Take 1 tablet by mouth 2 (two) times daily. Patient not taking: Reported on 09/07/2016 12/15/15   Hassan Rowan, MD  cetirizine-pseudoephedrine (ZYRTEC-D) 5-120 MG tablet Take 1 tablet by mouth 2 (two) times daily. 12/15/15   Hassan Rowan, MD  fluticasone (FLONASE) 50  MCG/ACT nasal spray Place 2 sprays into both nostrils daily. 12/15/15   Hassan Rowan, MD  metoprolol succinate (TOPROL-XL) 25 MG 24 hr tablet  07/18/16   [provider]     Allergies Patient has no known allergies.  Family History  Problem Relation Age of Onset  . Breast cancer Paternal Aunt 27  . Hypertension Mother        BORDERLINE 2006  . Cancer Maternal Aunt 70       UTERINE  . Breast cancer Cousin        mat cousin  . Breast cancer Sister 25    Social History Social History   Tobacco Use  . Smoking status: Current Every Day Smoker    Packs/day: 1.00    Years: 1.00    Pack years: 1.00    Types: Cigarettes  . Smokeless tobacco: Never Used  Substance Use Topics  . Alcohol use: Yes    Alcohol/week: 2.0 standard drinks    Types: 2 Cans of beer per week  . Drug use: No    Review of Systems  Constitutional: No fever/chills Eyes: No visual changes.  ENT: No sore throat. Cardiovascular: As above Respiratory: Denies shortness of breath. Gastrointestinal: No abdominal pain.  Nausea as above Genitourinary: Negative for dysuria. Musculoskeletal: Negative for leg swelling Skin: Negative for rash. Neurological: Negative for headaches or weakness   ____________________________________________   PHYSICAL EXAM:  VITAL SIGNS: ED Triage Vitals [10/30/18 1108]  Enc Vitals Group  BP (!) 139/92     Pulse Rate 91     Resp 16     Temp 98 F (36.7 C)     Temp Source Oral     SpO2 100 %     Weight 66.7 kg (147 lb)     Height 1.676 m (5\' 6" )     Head Circumference      Peak Flow      Pain Score 3     Pain Loc      Pain Edu?      Excl. in GC?     Constitutional: Alert and oriented.   Nose: No congestion/rhinnorhea. Mouth/Throat: Mucous membranes are moist.   Neck:  Painless ROM Cardiovascular: Normal rate, regular rhythm. Grossly normal heart sounds.  Good peripheral circulation. Respiratory: Normal respiratory effort.  No retractions. Lungs CTAB.  Gastrointestinal: Soft and nontender. No distention.  No CVA tenderness.  Musculoskeletal: No lower extremity tenderness nor edema.  Warm and well perfused Neurologic:  Normal speech and language. No gross focal neurologic deficits are appreciated.  Skin:  Skin is warm, dry and intact. No rash noted. Psychiatric: Mood and affect are normal. Speech and behavior are normal.  ____________________________________________   LABS (all labs ordered are listed, but only abnormal results are displayed)  Labs Reviewed  BASIC METABOLIC PANEL - Abnormal; Notable for the following components:      Result Value   Calcium 10.4 (*)    All other components within normal limits  TROPONIN I (HIGH SENSITIVITY) - Abnormal; Notable for the following components:   Troponin I (High Sensitivity) 21 (*)    All other components within normal limits  SARS CORONAVIRUS 2 (TAT 6-24 HRS)  CBC   ____________________________________________  EKG  ED ECG REPORT I, Jene Every, the attending physician, personally viewed and interpreted this ECG.  Date: 10/30/2018  Rhythm: normal sinus rhythm QRS Axis: normal Intervals: normal ST/T Wave abnormalities: normal Narrative Interpretation: no evidence of acute ischemia  ____________________________________________  RADIOLOGY  Chest x-ray unremarkable ____________________________________________   PROCEDURES  Procedure(s) performed: No  Procedures   Critical Care performed: yes  CRITICAL CARE Performed by: Jene Every   Total critical care time: 30 minutes  Critical care time was exclusive of separately billable procedures and treating other patients.  Critical care was necessary to treat or prevent imminent or life-threatening deterioration.  Critical care was time spent personally by me on the following activities: development of treatment plan with patient and/or surrogate as well as nursing, discussions with consultants, evaluation of  patient's response to treatment, examination of patient, obtaining history from patient or surrogate, ordering and performing treatments and interventions, ordering and review of laboratory studies, ordering and review of radiographic studies, pulse oximetry and re-evaluation of patient's condition.  ____________________________________________   INITIAL IMPRESSION / ASSESSMENT AND PLAN / ED COURSE  Pertinent labs & imaging results that were available during my care of the patient were reviewed by me and considered in my medical decision making (see chart for details).  Patient presents with chest pain as described above, symptoms have improved however concerning for ACS, EKG is reassuring, initial troponin is elevated mildly.  Otherwise lab work is reassuring.  She was given nitro which helped her pain.  Does have a history of high blood pressure, smoking and believes she has hypercholesterol as well.  She will require admission for trending of troponins, evaluation by cardiology    ____________________________________________   FINAL CLINICAL IMPRESSION(S) / ED DIAGNOSES  Final diagnoses:  Chest pain, unspecified type  Elevated troponin        Note:  This document was prepared using Dragon voice recognition software and may include unintentional dictation errors.   Lavonia Drafts, MD 10/30/18 1225

## 2018-10-30 NOTE — Consult Note (Signed)
[facility] Cardiology  CARDIOLOGY CONSULT NOTE  Patient ID: Nicole Carpenter MRN: [id number] DOB/AGE: 1965-06-17 53 y.o.  Admit date: 10/30/2018 Referring Physician Elisabeth Pigeon Primary Physician [place] Primary Cardiologist Christell Constant Reason for Consultation chest pain  HPI: 53 year old female referred for evaluation of chest pain.  She reports a history of intermittent episodes of substernal chest discomfort.  This morning at approximately 9:30 AM, she developed substernal chest discomfort, pressure-like in sensation which radiated across her chest with associated nausea.  This lasted approximately 2 hours.  She presented to [facility] emergency room where ECG revealed normal ECG.  Admission labs were notable for initial borderline abnormal high-sensitivity troponin of 21 and 37.  Follow-up high-sensitivity troponin was 6.  Patient currently denies chest pain.  Review of systems complete and found to be negative unless listed above     Past Medical History:  Diagnosis Date  . Breast cyst, left 2002   LEFT BREAST CYST RESOLVED  . Environmental allergies   . Fractured pelvis (HCC) 1988   [facility]  . History of degenerative disc disease   . History of mammogram 06/27/2013; 07/07/14   BIRADS 1; NEG  . History of Papanicolaou smear of cervix 03/29/11; 06/09/14   -/-; -/-  . Hypertension     Past Surgical History:  Procedure Laterality Date  . CERVIX LESION DESTRUCTION  1989/90  . CHOLECYSTECTOMY  1993  . DIAGNOSTIC LAPAROSCOPY  1988  . FUNCTIONAL ENDOSCOPIC SINUS SURGERY  05/2009  . NASAL SEPTUM SURGERY  05/2009  . NECK SURGERY      Medications Prior to Admission  Medication Sig Dispense Refill Last Dose  . metoprolol succinate (TOPROL-XL) 25 MG 24 hr tablet Take 12.5 mg by mouth daily.   10/30/2018 at 0700  . omeprazole (PRILOSEC OTC) 20 MG tablet Take 20 mg by mouth daily.   10/30/2018 at 0700  . amoxicillin-clavulanate (AUGMENTIN) 875-125 MG tablet Take 1 tablet by mouth 2 (two) times daily. (Patient not  taking: Reported on 09/07/2016) 20 tablet 0   . cetirizine-pseudoephedrine (ZYRTEC-D) 5-120 MG tablet Take 1 tablet by mouth 2 (two) times daily. 30 tablet 0 prn at prn  . fluticasone (FLONASE) 50 MCG/ACT nasal spray Place 2 sprays into both nostrils daily. 16 g 0 prn at prn   Social History   Socioeconomic History  . Marital status: Married    Spouse name: Not on file  . Number of children: 2  . Years of education: 23  . Highest education level: Not on file  Occupational History  . Occupation: RESEARCH ADMIN MANAGER  Social Needs  . Financial resource strain: Not on file  . Food insecurity    Worry: Not on file    Inability: Not on file  . Transportation needs    Medical: Not on file    Non-medical: Not on file  Tobacco Use  . Smoking status: Current Every Day Smoker    Packs/day: 1.00    Years: 1.00    Pack years: 1.00    Types: Cigarettes  . Smokeless tobacco: Never Used  Substance and Sexual Activity  . Alcohol use: Yes    Alcohol/week: 2.0 standard drinks    Types: 2 Cans of beer per week  . Drug use: No  . Sexual activity: Yes    Birth control/protection: Surgical    Comment: VASECTOMY  Lifestyle  . Physical activity    Days per week: Not on file    Minutes per session: Not on file  . Stress: Not on file  Relationships  . Social Herbalist on phone: Not on file    Gets together: Not on file    Attends religious service: Not on file    Active member of club or organization: Not on file    Attends meetings of clubs or organizations: Not on file    Relationship status: Not on file  . Intimate partner violence    Fear of current or ex partner: Not on file    Emotionally abused: Not on file    Physically abused: Not on file    Forced sexual activity: Not on file  Other Topics Concern  . Not on file  Social History Narrative  . Not on file    Family History  Problem Relation Age of Onset  . Breast cancer Paternal Aunt 76  . Hypertension Mother         BORDERLINE 2006  . Cancer Maternal Aunt 70       UTERINE  . Breast cancer Cousin        mat cousin  . Breast cancer Sister 60      Review of systems complete and found to be negative unless listed above      PHYSICAL EXAM  General: Well developed, well nourished, in no acute distress HEENT:  Normocephalic and atramatic Neck:  No JVD.  Lungs: Clear bilaterally to auscultation and percussion. Heart: HRRR . Normal S1 and S2 without gallops or murmurs.  Abdomen: Bowel sounds are positive, abdomen soft and non-tender  Msk:  Back normal, normal gait. Normal strength and tone for age. Extremities: No clubbing, cyanosis or edema.   Neuro: Alert and oriented X 3. Psych:  Good affect, responds appropriately  Labs:   Lab Results  Component Value Date   WBC 8.5 10/30/2018   HGB 14.6 10/30/2018   HCT 45.4 10/30/2018   MCV 90.6 10/30/2018   PLT 297 10/30/2018    Recent Labs  Lab 10/30/18 1115  NA 143  K 4.3  CL 105  CO2 27  BUN 6  CREATININE 0.63  CALCIUM 10.4*  GLUCOSE 97   Lab Results  Component Value Date   CKTOTAL 61 12/17/2011   CKMB 0.9 12/17/2011   TROPONINI < 0.02 12/17/2011    Lab Results  Component Value Date   CHOL 155 12/16/2011   Lab Results  Component Value Date   HDL 28 (L) 12/16/2011   Lab Results  Component Value Date   LDLCALC 66 12/16/2011   Lab Results  Component Value Date   TRIG 307 (H) 12/16/2011   No results found for: CHOLHDL No results found for: LDLDIRECT    Radiology: Dg Chest 2 View  Result Date: 10/30/2018 CLINICAL DATA:  Chest pain EXAM: CHEST - 2 VIEW COMPARISON:  12/17/2011 FINDINGS: Mild pulmonary hyperinflation. Lungs well aerated and clear. Heart size and vascularity normal. No pleural fluid identified. IMPRESSION: Mild pulmonary hyperinflation without acute cardiopulmonary abnormality. Electronically Signed   By: Franchot Gallo M.D.   On: 10/30/2018 11:32    EKG: Normal ECG  ASSESSMENT AND PLAN:   1.   Chest pain, with typical and atypical features, normal ECG, initial borderline abnormal high sensory troponin, with follow-up normal value, currently chest pain-free  Recommendations  1.  Agree with current therapy 2.  For full dose anticoagulation 3.  Review 2D echocardiogram 4.  ETT Myoview in a.m.  Signed: Isaias Cowman MD,PhD, [facility] 10/30/2018, 4:47 PM

## 2018-10-30 NOTE — Plan of Care (Signed)
  Problem: Education: Goal: Knowledge of General Education information will improve Description: Including pain rating scale, medication(s)/side effects and non-pharmacologic comfort measures Outcome: Progressing   Problem: Activity: Goal: Risk for activity intolerance will decrease Outcome: Progressing   Problem: Nutrition: Goal: Adequate nutrition will be maintained Outcome: Progressing   

## 2018-10-30 NOTE — ED Notes (Addendum)
Pt taken to Xray. Husband screened and waiting in room.

## 2018-10-31 ENCOUNTER — Encounter: Payer: Self-pay | Admitting: Radiology

## 2018-10-31 ENCOUNTER — Ambulatory Visit: Payer: BLUE CROSS/BLUE SHIELD

## 2018-10-31 LAB — BASIC METABOLIC PANEL
Anion gap: 10 (ref 5–15)
BUN: 12 mg/dL (ref 6–20)
CO2: 24 mmol/L (ref 22–32)
Calcium: 9.3 mg/dL (ref 8.9–10.3)
Chloride: 104 mmol/L (ref 98–111)
Creatinine, Ser: 0.65 mg/dL (ref 0.44–1.00)
GFR calc Af Amer: >60 mL/min (ref >60)
GFR calc non Af Amer: >60 mL/min (ref >60)
Glucose, Bld: 94 mg/dL (ref 70–99)
Potassium: 3.9 mmol/L (ref 3.5–5.1)
Sodium: 138 mmol/L (ref 135–145)

## 2018-10-31 LAB — NM MYOCAR MULTI W/SPECT W/WALL MOTION / EF
LV dias vol: 16 mL (ref 46–106)
LV sys vol: 2 mL
SDS: 0
SRS: 5
SSS: 2
TID: 0.78

## 2018-10-31 LAB — CBC
HCT: 40.1 % (ref 36.0–46.0)
Hemoglobin: 13.1 g/dL (ref 12.0–15.0)
MCH: 29 pg (ref 26.0–34.0)
MCHC: 32.7 g/dL (ref 30.0–36.0)
MCV: 88.9 fL (ref 80.0–100.0)
Platelets: 266 10*3/uL (ref 150–400)
RBC: 4.51 MIL/uL (ref 3.87–5.11)
RDW: 14.5 % (ref 11.5–15.5)
WBC: 9.1 10*3/uL (ref 4.0–10.5)
nRBC: 0 % (ref 0.0–0.2)

## 2018-10-31 LAB — HIV ANTIBODY (ROUTINE TESTING W REFLEX): HIV Screen 4th Generation wRfx: NONREACTIVE

## 2018-10-31 MED ORDER — ALBUTEROL SULFATE HFA 108 (90 BASE) MCG/ACT IN AERS: 2.0000 | INHALATION_SPRAY | Freq: Four times a day (QID) | RESPIRATORY_TRACT | 0 refills | Status: AC | PRN

## 2018-10-31 MED ORDER — TECHNETIUM TC 99M TETROFOSMIN IV KIT
10.0000 | PACK | Freq: Once | INTRAVENOUS | Status: AC | PRN
  Administered 2018-10-31: 10:00:00 10.598 via INTRAVENOUS

## 2018-10-31 MED ORDER — REGADENOSON 0.4 MG/5ML IV SOLN
0.4000 mg | Freq: Once | INTRAVENOUS | Status: AC
  Administered 2018-10-31: 0.4 mg via INTRAVENOUS

## 2018-10-31 MED ORDER — TECHNETIUM TC 99M TETROFOSMIN IV KIT
30.0000 | PACK | Freq: Once | INTRAVENOUS | Status: AC | PRN
  Administered 2018-10-31: 12:00:00 28.82 via INTRAVENOUS

## 2018-10-31 NOTE — Discharge Summary (Signed)
[facility] [place] [facility]   PATIENT NAME: Nicole Carpenter    MR#:  [id number]  DATE OF BIRTH:  Jun 13, 1965  DATE OF ADMISSION:  10/30/2018   ADMITTING PHYSICIAN: Altamese Dilling, MD  DATE OF DISCHARGE:  10/31/18  PRIMARY CARE PHYSICIAN: Rolm Gala, MD   ADMISSION DIAGNOSIS:   Elevated troponin [R79.89] Chest pain, unspecified type [R07.9]  DISCHARGE DIAGNOSIS:   Principal Problem:   Ischemic chest pain [facility]) Active Problems:   Chest pain   SECONDARY DIAGNOSIS:   Past Medical History:  Diagnosis Date  . Breast cyst, left 2002   LEFT BREAST CYST RESOLVED  . Environmental allergies   . Fractured pelvis (HCC) 1988   [facility]  . History of degenerative disc disease   . History of mammogram 06/27/2013; 07/07/14   BIRADS 1; NEG  . History of Papanicolaou smear of cervix 03/29/11; 06/09/14   -/-; -/-  . Hypertension     HOSPITAL COURSE:   53 year old female with past medical history significant for GERD presents to the hospital secondary to substernal chest discomfort.  1.  Chest pain-very atypical in nature.  Could be musculoskeletal versus esophageal spasms. -No further chest pains in the hospital -High-sensitivity troponins were slightly elevated.  Repeat one normalized.  Appreciate cardiology consult. -Echocardiogram was normal. -Myoview was a low risk study.  EF is normal. -Patient can be discharged with outpatient follow-up  2.  Mild asthma-refilled her albuterol inhaler  3.  GERD-does not have reflux symptoms at baseline.  Has trouble swallowing and was placed on Prilosec which helps her symptoms.  4.  Allergies-Flonase nasal spray  Husband updated at bedside as well. Doing well. Will be discharged home today.  DISCHARGE CONDITIONS:   Guarded  CONSULTS OBTAINED:   Treatment Team:  Marcina Millard, MD  DRUG ALLERGIES:   No Known Allergies DISCHARGE MEDICATIONS:   Allergies as of 10/31/2018   No Known  Allergies     Medication List    STOP taking these medications   amoxicillin-clavulanate 875-125 MG tablet Commonly known as: Augmentin     TAKE these medications   albuterol 108 (90 Base) MCG/ACT inhaler Commonly known as: VENTOLIN HFA Inhale 2 puffs into the lungs every 6 (six) hours as needed for wheezing or shortness of breath.   cetirizine-pseudoephedrine 5-120 MG tablet Commonly known as: ZYRTEC-D Take 1 tablet by mouth 2 (two) times daily.   fluticasone 50 MCG/ACT nasal spray Commonly known as: Flonase Place 2 sprays into both nostrils daily.   metoprolol succinate 25 MG 24 hr tablet Commonly known as: TOPROL-XL Take 12.5 mg by mouth daily.   omeprazole 20 MG tablet Commonly known as: PRILOSEC OTC Take 20 mg by mouth daily.        DISCHARGE INSTRUCTIONS:   1. PCP f/u in 1-2 weeks  DIET:   Regular diet  ACTIVITY:   Activity as tolerated  OXYGEN:   Home Oxygen: No.  Oxygen Delivery: room air  DISCHARGE LOCATION:   home   If you experience worsening of your admission symptoms, develop shortness of breath, life threatening emergency, suicidal or homicidal thoughts you must seek medical attention immediately by calling 911 or calling your MD immediately  if symptoms less severe.  You Must read complete instructions/literature along with all the possible adverse reactions/side effects for all the Medicines you take and that have been prescribed to you. Take any new Medicines after you have completely understood and accpet all the possible adverse reactions/side effects.  Please note  You were cared for by a hospitalist during your hospital stay. If you have any questions about your discharge medications or the care you received while you were in the hospital after you are discharged, you can call the unit and asked to speak with the hospitalist on call if the hospitalist that took care of you is not available. Once you are discharged, your primary care  physician will handle any further medical issues. Please note that NO REFILLS for any discharge medications will be authorized once you are discharged, as it is imperative that you return to your primary care physician (or establish a relationship with a primary care physician if you do not have one) for your aftercare needs so that they can reassess your need for medications and monitor your lab values.    On the day of Discharge:  VITAL SIGNS:   Blood pressure 110/82, pulse 95, temperature 98.4 F (36.9 C), temperature source Oral, resp. rate 18, height 5\' 5"  (1.651 m), weight 66.9 kg, SpO2 94 %.  PHYSICAL EXAMINATION:    GENERAL:  53 y.o.-year-old patient lying in the bed with no acute distress.  EYES: Pupils equal, round, reactive to light and accommodation. No scleral icterus. Extraocular muscles intact.  HEENT: Head atraumatic, normocephalic. Oropharynx and nasopharynx clear.  NECK:  Supple, no jugular venous distention. No thyroid enlargement, no tenderness.  LUNGS: Normal breath sounds bilaterally, no wheezing, rales,rhonchi or crepitation. No use of accessory muscles of respiration.  CARDIOVASCULAR: S1, S2 normal. No murmurs, rubs, or gallops.  ABDOMEN: Soft, non-tender, non-distended. Bowel sounds present. No organomegaly or mass.  EXTREMITIES: No pedal edema, cyanosis, or clubbing.  NEUROLOGIC: Cranial nerves II through XII are intact. Muscle strength 5/5 in all extremities. Sensation intact. Gait not checked.  PSYCHIATRIC: The patient is alert and oriented x 3.  SKIN: No obvious rash, lesion, or ulcer.   DATA REVIEW:   CBC Recent Labs  Lab 10/31/18 0448  WBC 9.1  HGB 13.1  HCT 40.1  PLT 266    Chemistries  Recent Labs  Lab 10/31/18 0448  NA 138  K 3.9  CL 104  CO2 24  GLUCOSE 94  BUN 12  CREATININE 0.65  CALCIUM 9.3     Microbiology Results  Results for orders placed or performed during the hospital encounter of 10/30/18  SARS CORONAVIRUS 2 (TAT 6-24  HRS) Nasopharyngeal Nasopharyngeal Swab     Status: None   Collection Time: 10/30/18  1:15 PM   Specimen: Nasopharyngeal Swab  Result Value Ref Range Status   SARS Coronavirus 2 NEGATIVE NEGATIVE Final    Comment: (NOTE) SARS-CoV-2 target nucleic acids are NOT DETECTED. The SARS-CoV-2 RNA is generally detectable in upper and lower respiratory specimens during the acute phase of infection. Negative results do not preclude SARS-CoV-2 infection, do not rule out co-infections with other pathogens, and should not be used as the sole basis for treatment or other patient management decisions. Negative results must be combined with clinical observations, patient history, and epidemiological information. The expected result is Negative. Fact Sheet for Patients: HairSlick.nohttps://www.fda.gov/media/138098/download Fact Sheet for Healthcare Providers: quierodirigir.comhttps://www.fda.gov/media/138095/download This test is not yet approved or cleared by the [place] FDA and  has been authorized for detection and/or diagnosis of SARS-CoV-2 by FDA under an Emergency Use Authorization (EUA). This EUA will remain  in effect (meaning this test can be used) for the duration of the COVID-19 declaration under Section 56 4(b)(1) of the Act, 21 U.S.C. section 360bbb-3(b)(1), unless the  authorization is terminated or revoked sooner. Performed at [facility], [place] [address], [place], [place] [address]     RADIOLOGY:  Nm Myocar Multi W/spect W/wall Motion / Ef  Result Date: 10/31/2018  There was no ST segment deviation noted during stress.  Blood pressure demonstrated a normal response to exercise.  The study is normal.  This is a low risk study.  The left ventricular ejection fraction is hyperdynamic (>65%).      Management plans discussed with the patient, family and they are in agreement.  CODE STATUS:     Code Status Orders  (From admission, onward)         Start     Ordered   10/30/18 1441  Full  code  Continuous     10/30/18 1440        Code Status History    This patient has a current code status but no historical code status.   Advance Care Planning Activity    Advance Directive Documentation     Most Recent Value  Type of Advance Directive  Healthcare Power of Attorney, Living will  Pre-existing out of facility DNR order (yellow form or pink MOST form)  -  "MOST" Form in Place?  -      TOTAL TIME TAKING CARE OF THIS PATIENT: 38 minutes.    Gladstone Lighter M.D on 10/31/2018 at 3:08 PM  Between 7am to 6pm - Pager - [phone]  After 6pm go to [email or web address] - Proofreader  [facility]  Office  [phone]  CC: Primary care physician; Hortencia Pilar, MD   Note: This dictation was prepared with Dragon dictation along with smaller phrase technology. Any transcriptional errors that result from this process are unintentional.

## 2018-10-31 NOTE — Progress Notes (Signed)
To nuclear medicine via wc.

## 2018-10-31 NOTE — Plan of Care (Signed)
?  Problem: Clinical Measurements: ?Goal: Respiratory complications will improve ?Outcome: Progressing ?Goal: Cardiovascular complication will be avoided ?Outcome: Progressing ?  ?Problem: Activity: ?Goal: Risk for activity intolerance will decrease ?Outcome: Progressing ?  ?Problem: Coping: ?Goal: Level of anxiety will decrease ?Outcome: Progressing ?  ?Problem: Elimination: ?Goal: Will not experience complications related to urinary retention ?Outcome: Progressing ?  ?Problem: Pain Managment: ?Goal: General experience of comfort will improve ?Outcome: Progressing ?  ?Problem: Safety: ?Goal: Ability to remain free from injury will improve ?Outcome: Progressing ?  ?Problem: Skin Integrity: ?Goal: Risk for impaired skin integrity will decrease ?Outcome: Progressing ?  ?

## 2018-10-31 NOTE — Plan of Care (Signed)
  Problem: Health Behavior/Discharge Planning: Goal: Ability to manage health-related needs will improve Outcome: Adequate for Discharge   Problem: Clinical Measurements: Goal: Ability to maintain clinical measurements within normal limits will improve Outcome: Adequate for Discharge Goal: Will remain free from infection Outcome: Adequate for Discharge Goal: Diagnostic test results will improve Outcome: Adequate for Discharge Goal: Respiratory complications will improve Outcome: Adequate for Discharge Goal: Cardiovascular complication will be avoided Outcome: Adequate for Discharge   Problem: Activity: Goal: Risk for activity intolerance will decrease Outcome: Adequate for Discharge   Problem: Coping: Goal: Level of anxiety will decrease Outcome: Adequate for Discharge   Problem: Elimination: Goal: Will not experience complications related to bowel motility Outcome: Adequate for Discharge Goal: Will not experience complications related to urinary retention Outcome: Adequate for Discharge   Problem: Pain Managment: Goal: General experience of comfort will improve Outcome: Adequate for Discharge   Problem: Safety: Goal: Ability to remain free from injury will improve Outcome: Adequate for Discharge   Problem: Skin Integrity: Goal: Risk for impaired skin integrity will decrease Outcome: Adequate for Discharge   

## 2018-10-31 NOTE — Progress Notes (Signed)
Pt discharged to home via wc.  Instructions  given to pt.  Questions answered.  No distress.  

## 2018-11-28 ENCOUNTER — Other Ambulatory Visit: Payer: Self-pay | Admitting: Family Medicine

## 2018-12-09 ENCOUNTER — Other Ambulatory Visit: Payer: Self-pay | Admitting: Family Medicine

## 2018-12-09 ENCOUNTER — Other Ambulatory Visit: Payer: Self-pay | Admitting: Medical Oncology

## 2018-12-09 DIAGNOSIS — Z1231 Encounter for screening mammogram for malignant neoplasm of breast: Secondary | ICD-10-CM

## 2018-12-09 LAB — ECHOCARDIOGRAM COMPLETE
Height: 65 in
Weight: 2331.2 oz

## 2018-12-26 ENCOUNTER — Other Ambulatory Visit: Payer: Self-pay

## 2018-12-26 ENCOUNTER — Ambulatory Visit
Admission: RE | Admit: 2018-12-26 | Discharge: 2018-12-26 | Disposition: A | Discharge status: Home / Self Care | Payer: BLUE CROSS/BLUE SHIELD | Source: Ambulatory Visit | Attending: Family Medicine | Admitting: Family Medicine

## 2018-12-26 DIAGNOSIS — Z1231 Encounter for screening mammogram for malignant neoplasm of breast: Secondary | ICD-10-CM | POA: Diagnosis not present

## 2018-12-31 ENCOUNTER — Other Ambulatory Visit: Payer: Self-pay | Admitting: Family Medicine

## 2018-12-31 DIAGNOSIS — N6310 Unspecified lump in the right breast, unspecified quadrant: Secondary | ICD-10-CM

## 2018-12-31 DIAGNOSIS — R921 Mammographic calcification found on diagnostic imaging of breast: Secondary | ICD-10-CM

## 2018-12-31 DIAGNOSIS — R928 Other abnormal and inconclusive findings on diagnostic imaging of breast: Secondary | ICD-10-CM

## 2019-01-07 ENCOUNTER — Ambulatory Visit
Admission: RE | Admit: 2019-01-07 | Discharge: 2019-01-07 | Disposition: A | Discharge status: Home / Self Care | Payer: BLUE CROSS/BLUE SHIELD | Source: Ambulatory Visit | Attending: Family Medicine | Admitting: Family Medicine

## 2019-01-07 DIAGNOSIS — R921 Mammographic calcification found on diagnostic imaging of breast: Secondary | ICD-10-CM

## 2019-01-07 DIAGNOSIS — R928 Other abnormal and inconclusive findings on diagnostic imaging of breast: Secondary | ICD-10-CM | POA: Insufficient documentation

## 2019-01-07 DIAGNOSIS — N6310 Unspecified lump in the right breast, unspecified quadrant: Secondary | ICD-10-CM

## 2019-01-08 ENCOUNTER — Other Ambulatory Visit: Payer: Self-pay | Admitting: Family Medicine

## 2019-01-08 DIAGNOSIS — R928 Other abnormal and inconclusive findings on diagnostic imaging of breast: Secondary | ICD-10-CM

## 2019-01-08 DIAGNOSIS — R921 Mammographic calcification found on diagnostic imaging of breast: Secondary | ICD-10-CM

## 2019-04-10 ENCOUNTER — Ambulatory Visit: Payer: BC Managed Care – PPO | Admitting: Obstetrics and Gynecology

## 2019-04-10 ENCOUNTER — Encounter: Payer: Self-pay | Admitting: Obstetrics and Gynecology

## 2019-04-10 ENCOUNTER — Other Ambulatory Visit: Payer: Self-pay

## 2019-04-10 VITALS — BP 150/98 | HR 111 | Ht 65.0 in | Wt 150.0 lb

## 2019-04-10 DIAGNOSIS — N83201 Unspecified ovarian cyst, right side: Secondary | ICD-10-CM

## 2019-04-10 NOTE — Progress Notes (Signed)
Obstetrics & Gynecology Office Visit   Chief Complaint:  Chief Complaint  Patient presents with  . Advice Only    right ovarian cyst forund on CT/Ultrasound from [facility]    History of Present Illness: The patient is a 54 y.o. female presenting for emergency room follow up concerning a recently imaged right adnexal cyst.  Initial presentation was prompted by abdominal pain.  Previous CT and ultrasound imaging on 03/23/2019 demonstrated dimensions of 3.1cm, preserved doppler flow.  Appearance was notable complex appearance, normal doppler flow, findgins most consistent with hemorrhagic cyst, no free fluid, no lymphadenopathy, no omental caking and absence of ascites. The patient endorses associated symptoms of abdominal pain.  Abdominal pain has resolved since initial presentation.  The patient denies associated symptoms of  abdominal pain.  There is not a notable family history of ovarian cancer, uterine cancer, breast cancer, or colon cancer.  Review of Systems: 10 point review of systems negative unless otherwise noted in HPI  Past Medical History:  Patient Active Problem List   Diagnosis Date Noted  . Ischemic chest pain (HCC) 10/30/2018  . Chest pain 10/30/2018    Past Surgical History:  Past Surgical History:  Procedure Laterality Date  . CERVIX LESION DESTRUCTION  1989/90  . CHOLECYSTECTOMY  1993  . DIAGNOSTIC LAPAROSCOPY  1988  . FUNCTIONAL ENDOSCOPIC SINUS SURGERY  05/2009  . NASAL SEPTUM SURGERY  05/2009  . NECK SURGERY      Gynecologic History: Patient's last menstrual period was 03/26/2019.  Obstetric History: [id number]  Family History:  Family History  Problem Relation Age of Onset  . Breast cancer Paternal Aunt 50  . Hypertension Mother        BORDERLINE 2006  . Cancer Maternal Aunt 70       UTERINE  . Breast cancer Cousin        mat cousin  . Breast cancer Sister 67    Social History:  Social History   Socioeconomic History  . Marital status: Married   Spouse name: Not on file  . Number of children: 2  . Years of education: 3  . Highest education level: Not on file  Occupational History  . Occupation: RESEARCH ADMIN MANAGER  Tobacco Use  . Smoking status: Current Every Day Smoker    Packs/day: 1.00    Years: 1.00    Pack years: 1.00    Types: Cigarettes  . Smokeless tobacco: Never Used  Substance and Sexual Activity  . Alcohol use: Yes    Alcohol/week: 2.0 standard drinks    Types: 2 Cans of beer per week  . Drug use: No  . Sexual activity: Yes    Birth control/protection: Surgical    Comment: VASECTOMY  Other Topics Concern  . Not on file  Social History Narrative  . Not on file   Social Determinants of Health   Financial Resource Strain:   . Difficulty of Paying Living Expenses: Not on file  Food Insecurity:   . Worried About Programme researcher, broadcasting/film/video in the Last Year: Not on file  . Ran Out of Food in the Last Year: Not on file  Transportation Needs:   . Lack of Transportation (Medical): Not on file  . Lack of Transportation (Non-Medical): Not on file  Physical Activity:   . Days of Exercise per Week: Not on file  . Minutes of Exercise per Session: Not on file  Stress:   . Feeling of Stress : Not on file  Social Connections:   .  Frequency of Communication with Friends and Family: Not on file  . Frequency of Social Gatherings with Friends and Family: Not on file  . Attends Religious Services: Not on file  . Active Member of Clubs or Organizations: Not on file  . Attends Archivist Meetings: Not on file  . Marital Status: Not on file  Intimate Partner Violence:   . Fear of Current or Ex-Partner: Not on file  . Emotionally Abused: Not on file  . Physically Abused: Not on file  . Sexually Abused: Not on file    Allergies:  No Known Allergies  Medications: Prior to Admission medications   Medication Sig Start Date End Date Taking? Authorizing Provider  albuterol (VENTOLIN HFA) 108 (90 Base) MCG/ACT  inhaler Inhale 2 puffs into the lungs every 6 (six) hours as needed for wheezing or shortness of breath. 10/31/18  Yes Gladstone Lighter, MD  cetirizine-pseudoephedrine (ZYRTEC-D) 5-120 MG tablet Take 1 tablet by mouth 2 (two) times daily. 12/15/15  Yes Frederich Cha, MD  fluticasone [facility]) 50 MCG/ACT nasal spray Place 2 sprays into both nostrils daily. 12/15/15  Yes Frederich Cha, MD  metoprolol succinate (TOPROL-XL) 25 MG 24 hr tablet Take 12.5 mg by mouth daily. 07/24/18  Yes [provider]  omeprazole (PRILOSEC OTC) 20 MG tablet Take 20 mg by mouth daily.   Yes [provider]    Physical Exam Vitals:  Vitals:   04/10/19 0852  BP: (!) 150/98  Pulse: (!) 111   Patient's last menstrual period was 03/26/2019.  General: NAD, well nourished, appears stated age 33: normocephalic, anicteric Pulmonary: No increased work of breathing Neurologic: Grossly intact Psychiatric: mood appropriate, affect full  No results found.  Assessment: 54 y.o. [id number] presenting for right ovarian cyst  Plan: Problem List Items Addressed This Visit    None    Visit Diagnoses    Ovarian cyst, right    -  Primary   Relevant Orders   US Transvaginal Non-OB       1) The incidence and implication of adnexal masses and ovarian cysts were discussed with the patient in detail.  Prior imaging if available was reviewed at today's visit..  The vast majority of these lesions will represent benign or physiologic processes and may well resolve on repeat imaging with expectant management.  We discussed that in a premenopausal patient not on ovulation suppression with via a systemic form hormonal contraception the normal function of the ovary during follicular development is the formation of a dominant follicle or cyst(s) every month.  This is an essential part of normal reproductive physiology.  In some cases these cysts can take on larger dimensions, hemorrhage, or undergo torsion making them  symptomatic. Torsion is relatively unlikely for lesions under 5 cm.  Based on initial imaging findings the overall concern for malignancy is deemed low.  We will obtain follow up imagine approximately 6 weeks from the date of the initial imaging study.  Torsion precautions were given.  - [place] and CT reads favor hemorrhagic ovarian cyst, no evidence of free fluid of lymphadenopathy, the patient is still menstruating.  DDx includes endometrioma - Repeat ultrasound order.  Management will hinge on interval changes  2) Tumor makers were not ordered  3) A total of 15 minutes were spent in face-to-face contact with the patient during this encounter with over half of that time devoted to counseling and coordination of care.  4) Return in about 1 week (around 04/17/2019) for TVUS and follow up.  Vena Austria, MD, [facility], [facility]

## 2019-04-17 ENCOUNTER — Encounter: Payer: BC Managed Care – PPO | Admitting: Advanced Practice Midwife

## 2019-04-17 ENCOUNTER — Other Ambulatory Visit: Payer: BC Managed Care – PPO

## 2019-04-21 ENCOUNTER — Other Ambulatory Visit: Payer: Self-pay

## 2019-04-21 ENCOUNTER — Other Ambulatory Visit (INDEPENDENT_AMBULATORY_CARE_PROVIDER_SITE_OTHER): Payer: BC Managed Care – PPO

## 2019-04-21 ENCOUNTER — Ambulatory Visit (INDEPENDENT_AMBULATORY_CARE_PROVIDER_SITE_OTHER): Payer: BC Managed Care – PPO | Admitting: Obstetrics and Gynecology

## 2019-04-21 ENCOUNTER — Encounter: Payer: Self-pay | Admitting: Obstetrics and Gynecology

## 2019-04-21 VITALS — BP 118/78 | HR 81 | Ht 65.0 in | Wt 153.0 lb

## 2019-04-21 DIAGNOSIS — R102 Pelvic and perineal pain: Secondary | ICD-10-CM | POA: Diagnosis not present

## 2019-04-21 DIAGNOSIS — N83202 Unspecified ovarian cyst, left side: Secondary | ICD-10-CM

## 2019-04-21 DIAGNOSIS — N852 Hypertrophy of uterus: Secondary | ICD-10-CM | POA: Diagnosis not present

## 2019-04-21 DIAGNOSIS — N83201 Unspecified ovarian cyst, right side: Secondary | ICD-10-CM

## 2019-04-21 DIAGNOSIS — N83291 Other ovarian cyst, right side: Secondary | ICD-10-CM

## 2019-04-21 NOTE — Progress Notes (Signed)
Gynecology Ultrasound Follow Up  Chief Complaint:  Chief Complaint  Patient presents with  . Follow-up    GYN Ultrasound     History of Present Illness: Patient is a 54 y.o. female who presents today for ultrasound evaluation of right ovarian cyst.  Patient remains asymptomatic following initial presentation with abdominal pain.  Ultrasound demonstrates the following findgins Adnexa: normal right ovary, small simple left ovarian cyst Uterus:non-enlarged with endometrial stripe thickened but patient expecting menses to start any day Additional: no free fluid  Review of Systems: Review of Systems  Constitutional: Negative.   Gastrointestinal: Negative.   Genitourinary: Negative.     Past Medical History:  Past Medical History:  Diagnosis Date  . Breast cyst, left 2002   LEFT BREAST CYST RESOLVED  . Environmental allergies   . Fractured pelvis (HCC) 1988   [facility]  . History of degenerative disc disease   . History of mammogram 06/27/2013; 07/07/14   BIRADS 1; NEG  . History of Papanicolaou smear of cervix 03/29/11; 06/09/14   -/-; -/-  . Hypertension     Past Surgical History:  Past Surgical History:  Procedure Laterality Date  . CERVIX LESION DESTRUCTION  1989/90  . CHOLECYSTECTOMY  1993  . DIAGNOSTIC LAPAROSCOPY  1988  . FUNCTIONAL ENDOSCOPIC SINUS SURGERY  05/2009  . NASAL SEPTUM SURGERY  05/2009  . NECK SURGERY      Gynecologic History:  Patient's last menstrual period was 03/26/2019. Last Pap: 06/09/2014 Results were: .NIL HPV negative  Family History:  Family History  Problem Relation Age of Onset  . Breast cancer Paternal Aunt 52  . Hypertension Mother        BORDERLINE 2006  . Cancer Maternal Aunt 70       UTERINE  . Breast cancer Cousin        mat cousin  . Breast cancer Sister 62    Social History:  Social History   Socioeconomic History  . Marital status: Married    Spouse name: Not on file  . Number of children: 2  . Years of education:  13  . Highest education level: Not on file  Occupational History  . Occupation: RESEARCH ADMIN MANAGER  Tobacco Use  . Smoking status: Current Every Day Smoker    Packs/day: 1.00    Years: 1.00    Pack years: 1.00    Types: Cigarettes  . Smokeless tobacco: Never Used  Substance and Sexual Activity  . Alcohol use: Yes    Alcohol/week: 2.0 standard drinks    Types: 2 Cans of beer per week  . Drug use: No  . Sexual activity: Yes    Birth control/protection: Surgical    Comment: VASECTOMY  Other Topics Concern  . Not on file  Social History Narrative  . Not on file   Social Determinants of Health   Financial Resource Strain:   . Difficulty of Paying Living Expenses:   Food Insecurity:   . Worried About Programme researcher, broadcasting/film/video in the Last Year:   . Barista in the Last Year:   Transportation Needs:   . Freight forwarder (Medical):   Marland Kitchen Lack of Transportation (Non-Medical):   Physical Activity:   . Days of Exercise per Week:   . Minutes of Exercise per Session:   Stress:   . Feeling of Stress :   Social Connections:   . Frequency of Communication with Friends and Family:   . Frequency of Social Gatherings with  Friends and Family:   . Attends Religious Services:   . Active Member of Clubs or Organizations:   . Attends Archivist Meetings:   Marland Kitchen Marital Status:   Intimate Partner Violence:   . Fear of Current or Ex-Partner:   . Emotionally Abused:   Marland Kitchen Physically Abused:   . Sexually Abused:     Allergies:  No Known Allergies  Medications: Prior to Admission medications   Medication Sig Start Date End Date Taking? Authorizing Provider  albuterol (VENTOLIN HFA) 108 (90 Base) MCG/ACT inhaler Inhale 2 puffs into the lungs every 6 (six) hours as needed for wheezing or shortness of breath. 10/31/18   Gladstone Lighter, MD  cetirizine-pseudoephedrine (ZYRTEC-D) 5-120 MG tablet Take 1 tablet by mouth 2 (two) times daily. 12/15/15   Frederich Cha, MD    fluticasone (FLONASE) 50 MCG/ACT nasal spray Place 2 sprays into both nostrils daily. 12/15/15   Frederich Cha, MD  metoprolol succinate (TOPROL-XL) 25 MG 24 hr tablet Take 12.5 mg by mouth daily. 07/24/18   [provider]  omeprazole (PRILOSEC OTC) 20 MG tablet Take 20 mg by mouth daily.    [provider]    Physical Exam Vitals: Blood pressure 118/78, pulse 81, height 5\' 5"  (1.651 m), weight 153 lb (69.4 kg), last menstrual period 03/26/2019.  General: NAD HEENT: normocephalic, anicteric Pulmonary: No increased work of breathing Extremities: no edema, erythema, or tenderness Neurologic: Grossly intact, normal gait Psychiatric: mood appropriate, affect full  US Transvaginal Non-OB  Result Date: 04/21/2019 Patient Name: Nicole Carpenter DOB: 08/20/1965 MRN: [id number] ULTRASOUND REPORT Location: [facility] Date of Service: 04/21/2019 Indications: Pelvic Pain Findings: The uterus is anteverted and measures 9.0 x 5.2 x 4.6 cm. Echo texture is heterogenous without evidence of focal masses. The Endometrium measures ups to 32.8 mm. With Some cystic changes, around 2 mm cysts. Right Ovary measures 3.1 x 2.0 x 2.3 cm. It is normal in appearance. Left Ovary measures 2.7 x 1.7 x 1.8 cm. There is a simple cyst off the edge of the left ovary or beside it measuring 19.7 x 12.6 x 12.5 mm. It does not appear to move separately . Survey of the adnexa demonstrates no adnexal masses. There is no free fluid in the cul de sac. Impression: 1. The endometrium is thick, possibly up to 32.8 mm. 2. Normal right ovary. 3. There is a simple cyst adjacent to the left ovary. It may be off the edge. Recommendations: 1.Clinical correlation with the patient's History and Physical Exam. Gweneth Dimitri, RT Images reviewed.  Normal GYN study without visualized pathology.  The previously imaged complex hemorrhagic right ovarian cyst has resolved. The patient is expecting her menstrual cycle to start later  this week.  Malachy Mood, MD, [facility], [facility]    Assessment: 54 y.o. [phone] follow up imaging right ovarian cyst  Plan: Problem List Items Addressed This Visit    None    Visit Diagnoses    Right ovarian cyst    -  Primary      1) Right ovarian cyst - resolved on follow up imaging favoring initially read as hemorrhagic cyst as opposed to endometrioma  2) A total of 15 minutes were spent in face-to-face contact with the patient during this encounter with over half of that time devoted to counseling and coordination of care.  3) Return in about 6 months (around 10/22/2019) for pap.   Malachy Mood, MD, [facility], [facility]

## 2019-07-09 ENCOUNTER — Ambulatory Visit
Admission: RE | Admit: 2019-07-09 | Discharge: 2019-07-09 | Disposition: A | Discharge status: Home / Self Care | Payer: BC Managed Care – PPO | Source: Ambulatory Visit | Attending: Family Medicine | Admitting: Family Medicine

## 2019-07-09 DIAGNOSIS — R928 Other abnormal and inconclusive findings on diagnostic imaging of breast: Secondary | ICD-10-CM | POA: Insufficient documentation

## 2019-07-09 DIAGNOSIS — R921 Mammographic calcification found on diagnostic imaging of breast: Secondary | ICD-10-CM | POA: Insufficient documentation

## 2019-07-14 ENCOUNTER — Other Ambulatory Visit: Payer: Self-pay | Admitting: Family Medicine

## 2019-07-14 DIAGNOSIS — R928 Other abnormal and inconclusive findings on diagnostic imaging of breast: Secondary | ICD-10-CM

## 2019-07-14 DIAGNOSIS — R921 Mammographic calcification found on diagnostic imaging of breast: Secondary | ICD-10-CM

## 2019-07-17 ENCOUNTER — Ambulatory Visit
Admission: RE | Admit: 2019-07-17 | Discharge: 2019-07-17 | Disposition: A | Discharge status: Home / Self Care | Payer: BC Managed Care – PPO | Source: Ambulatory Visit | Attending: Family Medicine | Admitting: Family Medicine

## 2019-07-17 DIAGNOSIS — R928 Other abnormal and inconclusive findings on diagnostic imaging of breast: Secondary | ICD-10-CM

## 2019-07-17 DIAGNOSIS — R921 Mammographic calcification found on diagnostic imaging of breast: Secondary | ICD-10-CM

## 2019-07-17 HISTORY — PX: BREAST BIOPSY: SHX20

## 2019-07-18 LAB — SURGICAL PATHOLOGY

## 2019-10-23 ENCOUNTER — Ambulatory Visit: Payer: BC Managed Care – PPO | Admitting: Obstetrics and Gynecology

## 2020-01-27 ENCOUNTER — Emergency Department
Admission: EM | Admit: 2020-01-27 | Discharge: 2020-01-27 | Disposition: A | Discharge status: Home / Self Care | Payer: 59 | Attending: Emergency Medicine | Admitting: Emergency Medicine

## 2020-01-27 ENCOUNTER — Encounter: Payer: Self-pay | Admitting: Emergency Medicine

## 2020-01-27 ENCOUNTER — Other Ambulatory Visit: Payer: Self-pay

## 2020-01-27 ENCOUNTER — Emergency Department: Payer: 59

## 2020-01-27 DIAGNOSIS — R6884 Jaw pain: Secondary | ICD-10-CM | POA: Diagnosis not present

## 2020-01-27 DIAGNOSIS — F17210 Nicotine dependence, cigarettes, uncomplicated: Secondary | ICD-10-CM | POA: Insufficient documentation

## 2020-01-27 DIAGNOSIS — R079 Chest pain, unspecified: Secondary | ICD-10-CM

## 2020-01-27 DIAGNOSIS — R0789 Other chest pain: Secondary | ICD-10-CM | POA: Diagnosis not present

## 2020-01-27 DIAGNOSIS — R110 Nausea: Secondary | ICD-10-CM | POA: Insufficient documentation

## 2020-01-27 DIAGNOSIS — Z79899 Other long term (current) drug therapy: Secondary | ICD-10-CM | POA: Insufficient documentation

## 2020-01-27 DIAGNOSIS — I10 Essential (primary) hypertension: Secondary | ICD-10-CM | POA: Diagnosis not present

## 2020-01-27 HISTORY — DX: Anemia, unspecified: D64.9

## 2020-01-27 LAB — BASIC METABOLIC PANEL
Anion gap: 8 (ref 5–15)
BUN: 7 mg/dL (ref 6–20)
CO2: 25 mmol/L (ref 22–32)
Calcium: 9.1 mg/dL (ref 8.9–10.3)
Chloride: 108 mmol/L (ref 98–111)
Creatinine, Ser: 0.68 mg/dL (ref 0.44–1.00)
GFR, Estimated: >60 mL/min (ref >60)
Glucose, Bld: 99 mg/dL (ref 70–99)
Potassium: 4 mmol/L (ref 3.5–5.1)
Sodium: 141 mmol/L (ref 135–145)

## 2020-01-27 LAB — CBC
HCT: 39.1 % (ref 36.0–46.0)
Hemoglobin: 12 g/dL (ref 12.0–15.0)
MCH: 24.7 pg — ABNORMAL LOW (ref 26.0–34.0)
MCHC: 30.7 g/dL (ref 30.0–36.0)
MCV: 80.6 fL (ref 80.0–100.0)
Platelets: 261 10*3/uL (ref 150–400)
RBC: 4.85 MIL/uL (ref 3.87–5.11)
RDW: 22.5 % — ABNORMAL HIGH (ref 11.5–15.5)
WBC: 6.9 10*3/uL (ref 4.0–10.5)
nRBC: 0 % (ref 0.0–0.2)

## 2020-01-27 LAB — TROPONIN I (HIGH SENSITIVITY)
Troponin I (High Sensitivity): 3 ng/L (ref <18)
Troponin I (High Sensitivity): 3 ng/L (ref <18)

## 2020-01-27 NOTE — Discharge Instructions (Addendum)
Your EKGs and troponins were within normal limits.  I am not sure what was causing the chest pain.  I think you are safe to go home today but I do want you to follow-up in the next  day or 2 with cardiology. m until you see them I would like you to take 1 baby aspirin a day.  I would also want you to come back if you have any further symptoms of chest pain or tightness.  If your cardiologist cannot see you in the next 2 days please try 1 of ours.  Dr. Kirke Corin is on-call for unassigned patients with [facility] he is very good although I am not sure if your insurance will pay to cover him.  Dr. Darrold Junker is on-call for [facility] which is now part of [facility].  You can try him as well.  Again if you have any further symptoms please return.

## 2020-01-27 NOTE — ED Triage Notes (Signed)
Patient to ER from home via [facility] for c/o chest pain since yesterday. Patient reported two episodes of chest pressure yesterday that subsided. States another episode began this am that continues to be intermittent. Also developed pain to left jaw with +nausea. Patient received 324mg  ASA via [facility] at 0920. Patient had iron levels checked yesterday, but has otherwise felt well recently.

## 2020-01-27 NOTE — ED Provider Notes (Signed)
[facility] Emergency Department Provider Note   ____________________________________________   Event Date/Time   First MD Initiated Contact with Patient 01/27/20 1517     (approximate)  I have reviewed the triage vital signs and the nursing notes.   HISTORY  Chief Complaint Chest Pain    HPI JIMMA ORTMAN is a 54 y.o. female who reports 2 episodes of chest heaviness in the middle of her chest.  She also had some associated jaw pain and nausea that was coming and going.  She had 2 episodes yesterday that lasted about half an hour each and another one today.  They were not associated with exertion and exertion did not change them.  Nothing seemed to make them better or worse.  Patient is a smoker and has hypertension.  She is not currently having any chest pain.  Here troponins are negative EKG does not show any acute findings.  Patient has no fever no change in her usual smoker's cough.  She is not hypoxic or tachypneic or tachycardic         Past Medical History:  Diagnosis Date  . Anemia   . Breast cyst, left 2002   LEFT BREAST CYST RESOLVED  . Environmental allergies   . Fractured pelvis (HCC) 1988   [facility]  . History of degenerative disc disease   . History of mammogram 06/27/2013; 07/07/14   BIRADS 1; NEG  . History of Papanicolaou smear of cervix 03/29/11; 06/09/14   -/-; -/-  . Hypertension     Patient Active Problem List   Diagnosis Date Noted  . Ischemic chest pain (HCC) 10/30/2018  . Chest pain 10/30/2018    Past Surgical History:  Procedure Laterality Date  . BREAST BIOPSY Right 07/17/2019   stereo bx calcs upper, path pending, ribbon marker  . BREAST BIOPSY Right 07/17/2019   stereo bx of calcs lower, path pending, coil marker  . CERVIX LESION DESTRUCTION  1989/90  . CHOLECYSTECTOMY  1993  . DIAGNOSTIC LAPAROSCOPY  1988  . FUNCTIONAL ENDOSCOPIC SINUS SURGERY  05/2009  . NASAL SEPTUM SURGERY  05/2009  . NECK SURGERY       Prior to Admission medications   Medication Sig Start Date End Date Taking? Authorizing Provider  albuterol (VENTOLIN HFA) 108 (90 Base) MCG/ACT inhaler Inhale 2 puffs into the lungs every 6 (six) hours as needed for wheezing or shortness of breath. 10/31/18   Enid Baas, MD  cetirizine-pseudoephedrine (ZYRTEC-D) 5-120 MG tablet Take 1 tablet by mouth 2 (two) times daily. 12/15/15   Hassan Rowan, MD  fluticasone (FLONASE) 50 MCG/ACT nasal spray Place 2 sprays into both nostrils daily. 12/15/15   Hassan Rowan, MD  metoprolol succinate (TOPROL-XL) 25 MG 24 hr tablet Take 12.5 mg by mouth daily. 07/24/18   [provider]  omeprazole (PRILOSEC OTC) 20 MG tablet Take 20 mg by mouth daily.    [provider]    Allergies Patient has no known allergies.  Family History  Problem Relation Age of Onset  . Breast cancer Paternal Aunt 40  . Hypertension Mother        BORDERLINE 2006  . Cancer Maternal Aunt 70       UTERINE  . Breast cancer Cousin        mat cousin  . Breast cancer Sister 59    Social History Social History   Tobacco Use  . Smoking status: Current Every Day Smoker    Packs/day: 1.00    Years:  1.00    Pack years: 1.00    Types: Cigarettes  . Smokeless tobacco: Never Used  Vaping Use  . Vaping Use: Never used  Substance Use Topics  . Alcohol use: Yes    Alcohol/week: 2.0 standard drinks    Types: 2 Cans of beer per week  . Drug use: No    Review of Systems  Constitutional: No fever/chills Eyes: No visual changes. ENT: No sore throat. Cardiovascular: Currently denies chest pain. Respiratory: Denies shortness of breath. Gastrointestinal: No abdominal pain.  No nausea, no vomiting.  No diarrhea.  No constipation. Genitourinary: Negative for dysuria. Musculoskeletal: Negative for back pain. Skin: Negative for rash. Neurological: Negative for headaches, focal weakness   ____________________________________________   PHYSICAL  EXAM:  VITAL SIGNS: ED Triage Vitals  Enc Vitals Group     BP 01/27/20 1000 138/85     Pulse Rate 01/27/20 1000 88     Resp 01/27/20 1000 20     Temp 01/27/20 1000 98.2 F (36.8 C)     Temp Source 01/27/20 1000 Oral     SpO2 01/27/20 1000 97 %     Weight 01/27/20 1001 150 lb (68 kg)     Height 01/27/20 1001 5\' 5"  (1.651 m)     Head Circumference --      Peak Flow --      Pain Score 01/27/20 1000 3     Pain Loc --      Pain Edu? --      Excl. in GC? --     Constitutional: Alert and oriented. Well appearing and in no acute distress. Eyes: Conjunctivae are normal.  Head: Atraumatic. Nose: No congestion/rhinnorhea. Mouth/Throat: Mucous membranes are moist.  Oropharynx non-erythematous. Neck: No stridor.   Cardiovascular: Normal rate, regular rhythm. Grossly normal heart sounds.  Good peripheral circulation. Respiratory: Normal respiratory effort.  No retractions. Lungs CTAB. Gastrointestinal: Soft and nontender. No distention. No abdominal bruits. Musculoskeletal: No lower extremity tenderness nor edema.  Neurologic:  Normal speech and language. No gross focal neurologic deficits are appreciated.. Skin:  Skin is warm, dry and intact. No rash noted.  ____________________________________________   LABS (all labs ordered are listed, but only abnormal results are displayed)  Labs Reviewed  CBC - Abnormal; Notable for the following components:      Result Value   MCH 24.7 (*)    RDW 22.5 (*)    All other components within normal limits  BASIC METABOLIC PANEL  TROPONIN I (HIGH SENSITIVITY)  TROPONIN I (HIGH SENSITIVITY)   ____________________________________________  EKG  EKG read interpreted by me shows normal sinus rhythm rate of 93 normal axis no acute ST-T wave changes there is some decreased R wave progression but this is probably due to lead placement. _EKG #2 read interpreted by me shows normal sinus rhythm rate of 77 normal axis no significant change from #1  except of the baseline in the limb leads is much smoother.  ___________________________________________  RADIOLOGY 01/29/20, personally viewed and evaluated these images (plain radiographs) as part of my medical decision making, as well as reviewing the written report by the radiologist.  ED MD interpretation: Chest x-ray read by radiology as mild bilateral subsegmental atelectasis and cannot exclude infiltrate in the right midlung.  Patient's lung exam is clear patient is again not febrile tachypneic or tachycardic has no change in her smoker's cough and in fact has not coughed during her lung exam or while I was seeing her.  She  has a normal white count as well I doubt she has a pneumonia  Official radiology report(s): DG Chest 2 View  Result Date: 01/27/2020 CLINICAL DATA:  Chest pain. EXAM: CHEST - 2 VIEW COMPARISON:  10/30/2018. FINDINGS: Mediastinum and hilar structures normal. Mild bilateral subsegmental atelectasis. Mild infiltrate in the right mid lung cannot be excluded. No pleural effusion or pneumothorax. Degenerative changes scoliosis thoracic spine. IMPRESSION: Mild bilateral subsegmental atelectasis. Mild infiltrate right mid lung cannot be excluded. Electronically Signed   By: Maisie Fus  Register   On: 01/27/2020 10:51    ____________________________________________   PROCEDURES  Procedure(s) performed (including Critical Care):  Procedures   ____________________________________________   INITIAL IMPRESSION / ASSESSMENT AND PLAN / ED COURSE  Patient with risk factors however normal EKGs and troponins.  Chest symptoms did not change with exertion.  I believe she is okay to go home as long as she has close follow-up.  I will have her follow-up either with her cardiologist or one of the doctors here.  Dr. Darrold Junker is on-call for [facility] and Dr. Renato Gails is on-call for [facility].  I will give her both names.  I would like her to follow-up in the next couple  days and she is limited by her insurance to [employer].              ____________________________________________   FINAL CLINICAL IMPRESSION(S) / ED DIAGNOSES  Final diagnoses:  Nonspecific chest pain     ED Discharge Orders    None      *Please note:  TALEA MANGES was evaluated in Emergency Department on 01/27/2020 for the symptoms described in the history of present illness. She was evaluated in the context of the global COVID-19 pandemic, which necessitated consideration that the patient might be at risk for infection with the SARS-CoV-2 virus that causes COVID-19. Institutional protocols and algorithms that pertain to the evaluation of patients at risk for COVID-19 are in a state of rapid change based on information released by regulatory bodies including the CDC and federal and state organizations. These policies and algorithms were followed during the patient's care in the ED.  Some ED evaluations and interventions may be delayed as a result of limited staffing during and the pandemic.*   Note:  This document was prepared using Dragon voice recognition software and may include unintentional dictation errors.    Arnaldo Natal, MD 01/27/20 [phone]

## 2022-03-16 IMAGING — MG MM BREAST BX W/ LOC DEV EA AD LESION IMAG BX SPEC STEREO GUIDE*R
6 series · 6 of 6 positions shown · non-contrast
Comparison: Previous exams.
COMPARISON: Previous exams.

Addendum:
CLINICAL DATA: 53-year-old female presenting for stereotactic
biopsy of 2 groups of right breast calcifications.

EXAM:
RIGHT BREAST STEREOTACTIC CORE NEEDLE BIOPSY

[R (1 of 6)]
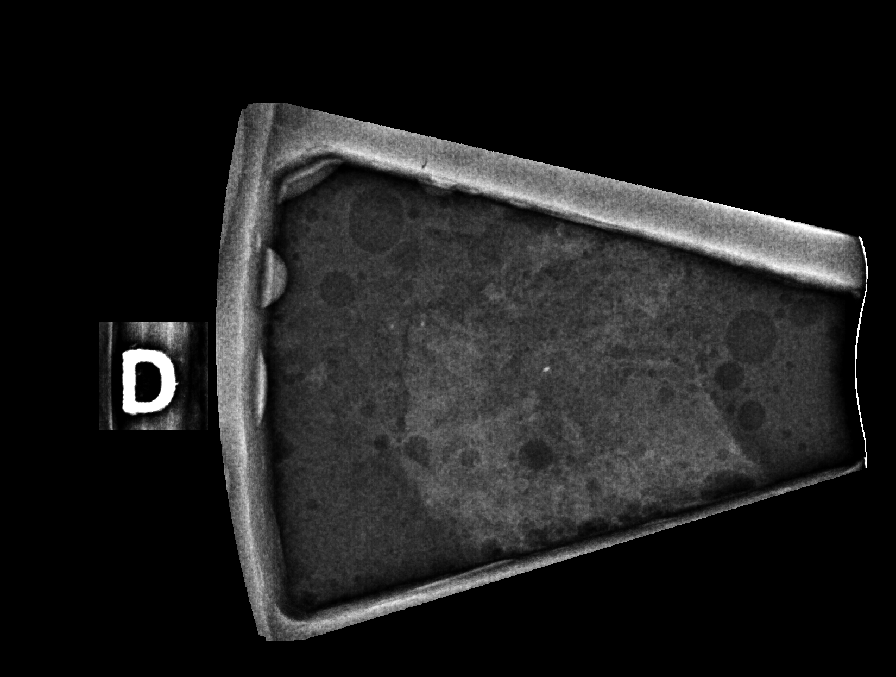

[R (2 of 6)]
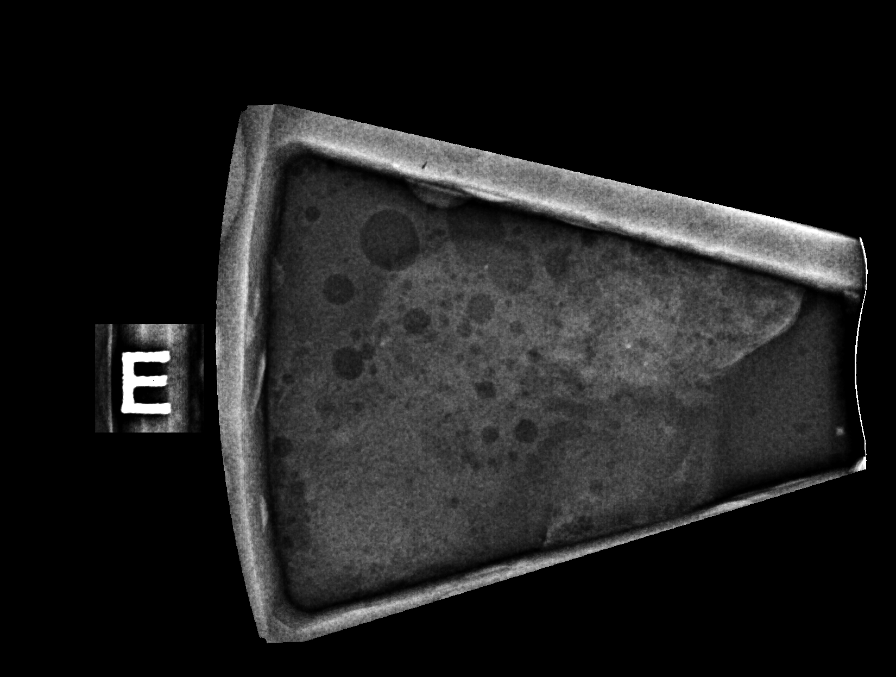

[R (3 of 6)]
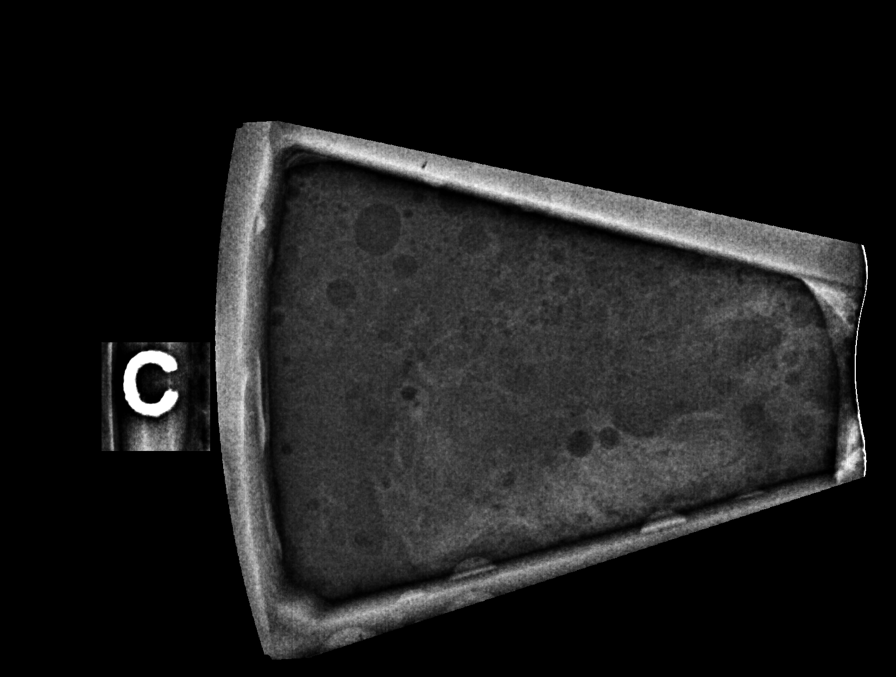

[R (4 of 6)]
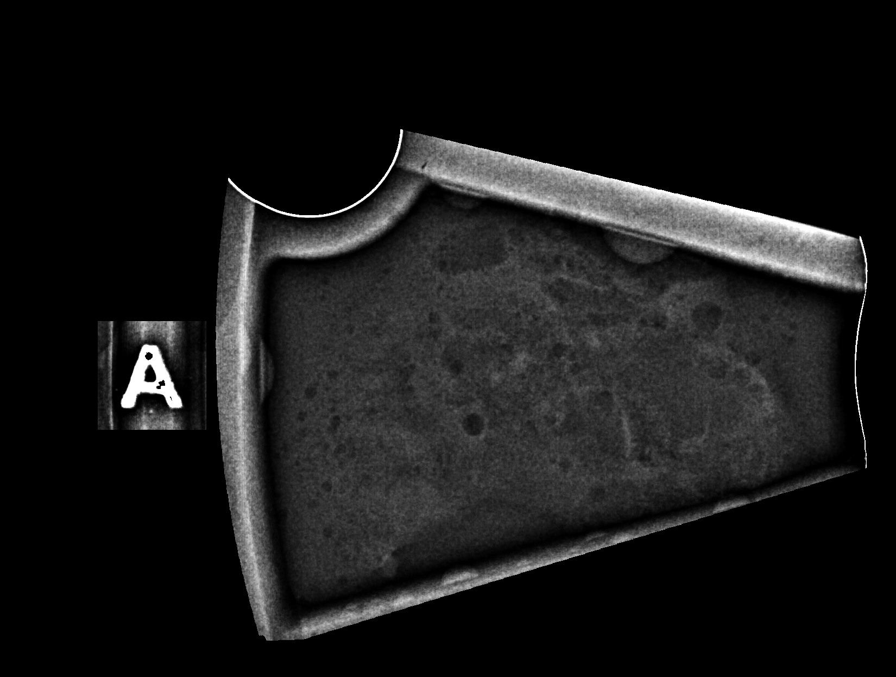

[R (5 of 6)]
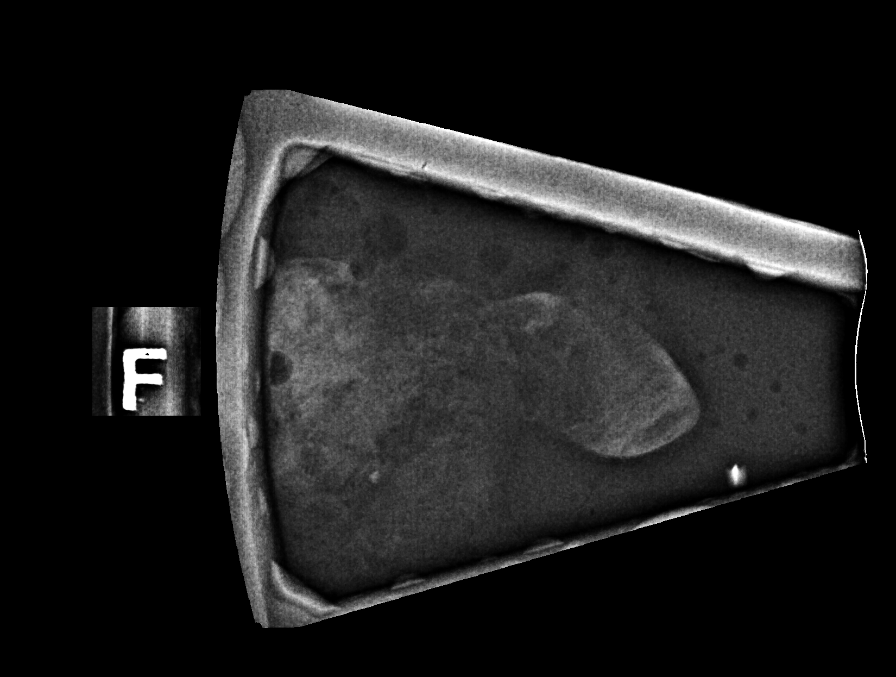

[R (6 of 6)]
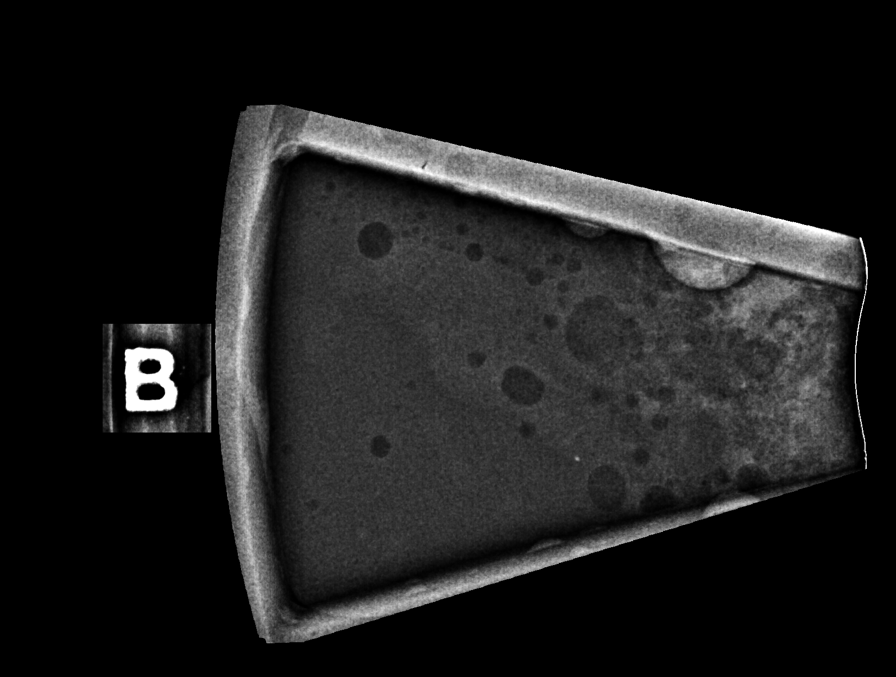

[6 of 6 positions shown; findings below may reference images not displayed]



#1 Lesion quadrant: Lower outer quadrant

Using sterile technique and 1% Lidocaine as local anesthetic, under
stereotactic guidance, a 9 gauge vacuum assisted device was used to
perform core needle biopsy of calcifications in the lower outer
quadrant of the right breast using a lateral approach. Specimen
radiograph was performed showing calcifications in multiple core
samples. Specimens with calcifications are identified for pathology.

At the conclusion of the procedure, a coil shaped tissue marker clip
was deployed into the biopsy cavity.

----------------------------

#2 Lesion quadrant: Upper outer quadrant

Using sterile technique and 1% Lidocaine as local anesthetic, under
stereotactic guidance, a 9 gauge vacuum assisted device was used to
perform core needle biopsy of calcifications in the upper outer
quadrant of the right breast using a lateral approach. Specimen
radiograph was performed showing calcifications in multiple core
samples. Specimens with calcifications are identified for pathology.

At the conclusion of the procedure, a ribbon shaped tissue marker
clip was deployed into the biopsy cavity.

Follow-up 2-view mammogram was performed and dictated separately.
IMPRESSION: 1. Stereotactic-guided biopsy of calcifications in the lower outer
right breast. No apparent complications.

2. Stereotactic-guided biopsy of calcifications in the upper-outer
right breast. No apparent complications.

ADDENDUM:
PATHOLOGY revealed:

A. RIGHT BREAST, LOWER OUTER CALCIFICATIONS; STEREOTACTIC BIOPSY: -
COLUMNAR CELL LESION WITHOUT ATYPIA, WITH ASSOCIATED COARSE
CALCIFICATIONS. - DENSE STROMAL FIBROSIS. - NEGATIVE FOR ATYPIA AND
MALIGNANCY.

B. RIGHT BREAST, UPPER OUTER CALCIFICATIONS; STEREOTACTIC BIOPSY: -
FIBROCYSTIC CHANGES AND COLUMNAR CELL CHANGE WITH ASSOCIATED
CALCIFICATIONS. - NEGATIVE FOR ATYPIA AND MALIGNANCY.

Pathology results are CONCORDANT with imaging findings, per Dr.
Ntumnyuy Berenger.

Pathology results and recommendations below were discussed with
patient by telephone on 07/18/2019. Patient reported biopsy site
doing well with slight tenderness at the site. Post biopsy care
instructions were reviewed and questions were answered. Patient was
instructed to call [HOSPITAL] if any concerns or
questions arise related to the biopsy.

Recommendation: Patient instructed to return in six months for
unilateral RIGHT breast diagnostic mammogram and possible ultrasound
for BIRADS 3 stromal fibrosis. Patient informed a reminder notice
will be sent regarding this appointment.

Addendum by Imbro Balit RN on 07/21/2019.



#1 Lesion quadrant: Lower outer quadrant

Using sterile technique and 1% Lidocaine as local anesthetic, under
stereotactic guidance, a 9 gauge vacuum assisted device was used to
perform core needle biopsy of calcifications in the lower outer
quadrant of the right breast using a lateral approach. Specimen
radiograph was performed showing calcifications in multiple core
samples. Specimens with calcifications are identified for pathology.

At the conclusion of the procedure, a coil shaped tissue marker clip
was deployed into the biopsy cavity.

----------------------------

#2 Lesion quadrant: Upper outer quadrant

Using sterile technique and 1% Lidocaine as local anesthetic, under
stereotactic guidance, a 9 gauge vacuum assisted device was used to
perform core needle biopsy of calcifications in the upper outer
quadrant of the right breast using a lateral approach. Specimen
radiograph was performed showing calcifications in multiple core
samples. Specimens with calcifications are identified for pathology.

At the conclusion of the procedure, a ribbon shaped tissue marker
clip was deployed into the biopsy cavity.

Follow-up 2-view mammogram was performed and dictated separately.
IMPRESSION: 1. Stereotactic-guided biopsy of calcifications in the lower outer
right breast. No apparent complications.

2. Stereotactic-guided biopsy of calcifications in the upper-outer
right breast. No apparent complications.

## 2022-10-12 ENCOUNTER — Emergency Department
Admission: EM | Admit: 2022-10-12 | Discharge: 2022-10-12 | Disposition: A | Discharge status: Home / Self Care | Payer: 59 | Attending: Emergency Medicine | Admitting: Emergency Medicine

## 2022-10-12 ENCOUNTER — Emergency Department: Payer: 59

## 2022-10-12 ENCOUNTER — Other Ambulatory Visit: Payer: Self-pay

## 2022-10-12 DIAGNOSIS — J449 Chronic obstructive pulmonary disease, unspecified: Secondary | ICD-10-CM | POA: Insufficient documentation

## 2022-10-12 DIAGNOSIS — R112 Nausea with vomiting, unspecified: Secondary | ICD-10-CM | POA: Diagnosis not present

## 2022-10-12 DIAGNOSIS — R079 Chest pain, unspecified: Secondary | ICD-10-CM | POA: Diagnosis present

## 2022-10-12 DIAGNOSIS — I10 Essential (primary) hypertension: Secondary | ICD-10-CM | POA: Insufficient documentation

## 2022-10-12 DIAGNOSIS — R0789 Other chest pain: Secondary | ICD-10-CM | POA: Insufficient documentation

## 2022-10-12 DIAGNOSIS — R110 Nausea: Secondary | ICD-10-CM

## 2022-10-12 LAB — CBC
HCT: 40.4 % (ref 36.0–46.0)
Hemoglobin: 12.8 g/dL (ref 12.0–15.0)
MCH: 28.1 pg (ref 26.0–34.0)
MCHC: 31.7 g/dL (ref 30.0–36.0)
MCV: 88.8 fL (ref 80.0–100.0)
Platelets: 308 10*3/uL (ref 150–400)
RBC: 4.55 MIL/uL (ref 3.87–5.11)
RDW: 15 % (ref 11.5–15.5)
WBC: 10.4 10*3/uL (ref 4.0–10.5)
nRBC: 0 % (ref 0.0–0.2)

## 2022-10-12 LAB — BASIC METABOLIC PANEL
Anion gap: 9 (ref 5–15)
BUN: 9 mg/dL (ref 6–20)
CO2: 24 mmol/L (ref 22–32)
Calcium: 9.5 mg/dL (ref 8.9–10.3)
Chloride: 104 mmol/L (ref 98–111)
Creatinine, Ser: 0.58 mg/dL (ref 0.44–1.00)
GFR, Estimated: >60 mL/min (ref >60)
Glucose, Bld: 93 mg/dL (ref 70–99)
Potassium: 4.2 mmol/L (ref 3.5–5.1)
Sodium: 137 mmol/L (ref 135–145)

## 2022-10-12 LAB — HEPATIC FUNCTION PANEL
ALT: 23 U/L (ref 0–44)
AST: 20 U/L (ref 15–41)
Albumin: 4 g/dL (ref 3.5–5.0)
Alkaline Phosphatase: 67 U/L (ref 38–126)
Bilirubin, Direct: 0.1 mg/dL (ref 0.0–0.2)
Indirect Bilirubin: 0.5 mg/dL (ref 0.3–0.9)
Total Bilirubin: 0.6 mg/dL (ref 0.3–1.2)
Total Protein: 6.9 g/dL (ref 6.5–8.1)

## 2022-10-12 LAB — LIPASE, BLOOD: Lipase: 51 U/L (ref 11–51)

## 2022-10-12 LAB — TROPONIN I (HIGH SENSITIVITY)
Troponin I (High Sensitivity): 2 ng/L (ref <18)
Troponin I (High Sensitivity): 3 ng/L (ref <18)

## 2022-10-12 NOTE — Discharge Instructions (Signed)
You were seen in the emergency department today for your chest pain symptoms.  Laboratory workup was reassuring at this time with no concern of damage to your heart today.  Please follow-up with your primary care doctor within a week.  Please return to the emergency department for any worsening symptoms.

## 2022-10-12 NOTE — ED Triage Notes (Signed)
Pt arrived via EMS from home. Pt sts that she had chest pressure that radiated to behind her right shoulder blade. EMS gave pt 324mg  of aspirin and 1 sublingual nitroglycerin. Pt is pain free in her chest at this time. Pt still complains of left jaw pain. Pt also received 4mg  of zofran via 20g IV in the left hand.

## 2022-10-12 NOTE — ED Provider Notes (Signed)
[facility] Provider Note    Event Date/Time   First MD Initiated Contact with Patient 10/12/22 1151     (approximate)   History   Chest Pain   HPI SOLANGIE GANLEY is a 57 y.o. female with HTN, COPD, chronic pack per day smoker presenting today for chest pain.  Patient reports feeling nauseous over the past couple days.  Earlier this morning she had some right shoulder pain and then had a very brief lasting a couple seconds chest tightness.  The symptoms resolved but she came to the emergency department for further evaluation of her heart.  At this time, denies chest pain, shortness of breath, vomiting, abdominal pain, leg pain, leg swelling.  No prior ACS history.     Physical Exam   Triage Vital Signs: ED Triage Vitals  Encounter Vitals Group     BP 10/12/22 1144 113/87     Systolic BP Percentile --      Diastolic BP Percentile --      Pulse Rate 10/12/22 1144 96     Resp 10/12/22 1144 19     Temp 10/12/22 1144 98.3 F (36.8 C)     Temp Source 10/12/22 1144 Oral     SpO2 10/12/22 1144 97 %     Weight 10/12/22 1141 155 lb (70.3 kg)     Height 10/12/22 1141 5\' 5"  (1.651 m)     Head Circumference --      Peak Flow --      Pain Score 10/12/22 1140 0     Pain Loc --      Pain Education --      Exclude from Growth Chart --     Most recent vital signs: Vitals:   10/12/22 1144  BP: 113/87  Pulse: 96  Resp: 19  Temp: 98.3 F (36.8 C)  SpO2: 97%   Physical Exam: I have reviewed the vital signs and nursing notes. General: Awake, alert, no acute distress.  Nontoxic appearing. Head:  Atraumatic, normocephalic.   ENT:  EOM intact, PERRL. Oral mucosa is pink and moist with no lesions. Neck: Neck is supple with full range of motion, No meningeal signs. Cardiovascular:  RRR, No murmurs. Peripheral pulses palpable and equal bilaterally. Respiratory:  Symmetrical chest wall expansion.  No rhonchi, rales, or wheezes.  Good air movement throughout.   No use of accessory muscles.   Musculoskeletal:  No cyanosis or edema. Moving extremities with full ROM Abdomen:  Soft, nontender, nondistended. Neuro:  GCS 15, moving all four extremities, interacting appropriately. Speech clear. Psych:  Calm, appropriate.   Skin:  Warm, dry, no rash.    ED Results / Procedures / Treatments   Labs (all labs ordered are listed, but only abnormal results are displayed) Labs Reviewed  BASIC METABOLIC PANEL  CBC  HEPATIC FUNCTION PANEL  LIPASE, BLOOD  POC URINE PREG, ED  TROPONIN I (HIGH SENSITIVITY)  TROPONIN I (HIGH SENSITIVITY)     EKG My EKG interpretation: Rate of 98, normal sinus rhythm, normal axis.  No acute ST elevations or depressions   RADIOLOGY Independently interpreted chest x-ray with no acute pathology   PROCEDURES:  Critical Care performed: No  Procedures   MEDICATIONS ORDERED IN ED: Medications - No data to display   IMPRESSION / MDM / ASSESSMENT AND PLAN / ED COURSE  I reviewed the triage vital signs and the nursing notes.  Differential diagnosis includes, but is not limited to, ACS, reflux, pancreatitis, pneumonia.  Patient's presentation is most consistent with acute illness / injury with system symptoms.  Patient is a 57 year old female presenting today for brief episode of chest tightness associated with nausea intermittently over the past couple of days.  Asymptomatic on arrival here and vital signs stable.  EKG with no evidence of ischemia.  Troponins at 2 and 3 are negative x 2 with very low suspicion for ACS as source of symptoms today.  Rest of laboratory workup was reassuring at this time.  Patient safe for discharge with no evidence of ACS and given strict return precautions.  Told to follow-up with PCP for ongoing intermittent nausea symptoms.  The patient is on the cardiac monitor to evaluate for evidence of arrhythmia and/or significant heart rate changes. Clinical Course  as of 10/12/22 1446  Thu Oct 12, 2022  1235 Troponin I (High Sensitivity): 2 [DW]  1235 Basic metabolic panel Unremarkable [DW]  1235 CBC Unremarkable [DW]  1248 Heart score is 3 on arrival before accounting for troponins [DW]  1347 DG Chest 2 View Independently interpreted with no acute cardiopulmonary pathology [DW]  1445 Reassessed patient.  Asymptomatic at this time.  Will discharge [DW]    Clinical Course User Index [DW] Janith Lima, MD     FINAL CLINICAL IMPRESSION(S) / ED DIAGNOSES   Final diagnoses:  Chest tightness  Nausea without vomiting     Rx / DC Orders   ED Discharge Orders     None        Note:  This document was prepared using Dragon voice recognition software and may include unintentional dictation errors.   Janith Lima, MD 10/12/22 [phone]

## 2022-10-16 NOTE — Group Note (Deleted)

## 2024-01-15 ENCOUNTER — Other Ambulatory Visit: Payer: Self-pay

## 2024-01-15 ENCOUNTER — Emergency Department: Admission: EM | Admit: 2024-01-15 | Discharge: 2024-01-15 | Disposition: A | Discharge status: Home / Self Care

## 2024-01-15 ENCOUNTER — Emergency Department

## 2024-01-15 DIAGNOSIS — R079 Chest pain, unspecified: Secondary | ICD-10-CM

## 2024-01-15 LAB — CBC
HCT: 35.2 % — ABNORMAL LOW (ref 36.0–46.0)
Hemoglobin: 10.9 g/dL — ABNORMAL LOW (ref 12.0–15.0)
MCH: 25 pg — ABNORMAL LOW (ref 26.0–34.0)
MCHC: 31 g/dL (ref 30.0–36.0)
MCV: 80.7 fL (ref 80.0–100.0)
Platelets: 311 K/uL (ref 150–400)
RBC: 4.36 MIL/uL (ref 3.87–5.11)
RDW: 14.8 % (ref 11.5–15.5)
WBC: 6.8 K/uL (ref 4.0–10.5)
nRBC: 0 % (ref 0.0–0.2)

## 2024-01-15 LAB — BASIC METABOLIC PANEL WITH GFR
Anion gap: 9 (ref 5–15)
BUN: 8 mg/dL (ref 6–20)
CO2: 28 mmol/L (ref 22–32)
Calcium: 9.9 mg/dL (ref 8.9–10.3)
Chloride: 104 mmol/L (ref 98–111)
Creatinine, Ser: 0.68 mg/dL (ref 0.44–1.00)
GFR, Estimated: >60 mL/min (ref >60)
Glucose, Bld: 88 mg/dL (ref 70–99)
Potassium: 3.9 mmol/L (ref 3.5–5.1)
Sodium: 141 mmol/L (ref 135–145)

## 2024-01-15 LAB — TROPONIN T, HIGH SENSITIVITY
Troponin T High Sensitivity: <15 ng/L (ref 0–19)
Troponin T High Sensitivity: <15 ng/L (ref 0–19)

## 2024-01-15 NOTE — ED Triage Notes (Signed)
 Pt to ED intermittent chest pain started yesterday with left jaw pain.

## 2024-01-15 NOTE — ED Provider Notes (Signed)
 [facility] Emergency Department Provider Note     Event Date/Time   First MD Initiated Contact with Patient 01/15/24 1606     (approximate)   History   Chest Pain   HPI  Nicole Carpenter is a 58 y.o. female with a history of ischemic chest pain, anemia, and HTN who presents with show to the ED via EMS.  Patient would endorse onset of intermittent chest pain yesterday, with some associated jaw pain.  She presents today via EMS for evaluation of the chest pain from home.  She denies any current complaints.  Vital signs reported as normal by EMS.    Physical Exam   Triage Vital Signs: ED Triage Vitals  Encounter Vitals Group     BP 01/15/24 1317 111/81     Girls Systolic BP Percentile --      Girls Diastolic BP Percentile --      Boys Systolic BP Percentile --      Boys Diastolic BP Percentile --      Pulse Rate 01/15/24 1317 89     Resp 01/15/24 1317 18     Temp 01/15/24 1317 98.7 F (37.1 C)     Temp src --      SpO2 01/15/24 1317 100 %     Weight 01/15/24 1318 164 lb (74.4 kg)     Height 01/15/24 1318 5' 5 (1.651 m)     Head Circumference --      Peak Flow --      Pain Score 01/15/24 1318 2     Pain Loc --      Pain Education --      Exclude from Growth Chart --     Most recent vital signs: Vitals:   01/15/24 1317 01/15/24 1733  BP: 111/81 115/78  Pulse: 89 79  Resp: 18 17  Temp: 98.7 F (37.1 C) 97.7 F (36.5 C)  SpO2: 100% 96%    General Awake, no distress. NAD HEENT NCAT. PERRL. EOMI. No rhinorrhea. Mucous membranes are moist.  CV:  Good peripheral perfusion. RRR RESP:  Normal effort. CTA ABD:  No distention.    ED Results / Procedures / Treatments   Labs (all labs ordered are listed, but only abnormal results are displayed) Labs Reviewed  CBC - Abnormal; Notable for the following components:      Result Value   Hemoglobin 10.9 (*)    HCT 35.2 (*)    MCH 25.0 (*)    All other components within normal limits   BASIC METABOLIC PANEL WITH GFR  TROPONIN T, HIGH SENSITIVITY  TROPONIN T, HIGH SENSITIVITY     EKG  Vent. rate 84 BPM  PR interval 162 ms  QRS duration 80 ms  QT/QTcB 378/446 ms  P-R-T axes 54 42 68  Normal sinus rhythm  Normal ECG  No STEMI  RADIOLOGY  I personally viewed and evaluated these images as part of my medical decision making, as well as reviewing the written report by the radiologist.  ED Provider Interpretation: No acute finding  DG Chest 2 View Result Date: 01/15/2024 CLINICAL DATA:  Chest pain. EXAM: DG CHEST 2V COMPARISON:  Chest radiograph dated 10/12/2022. FINDINGS: Left lung base linear atelectasis/scarring. No focal consolidation, pleural effusion or pneumothorax. The cardiac silhouette is within normal limits. No acute osseous pathology. IMPRESSION: No active cardiopulmonary disease. Electronically Signed   By: Vanetta Chou M.D.   On: 01/15/2024 15:14     PROCEDURES:  Critical  Care performed: No  Procedures   MEDICATIONS ORDERED IN ED: Medications - No data to display   IMPRESSION / MDM / ASSESSMENT AND PLAN / ED COURSE  I reviewed the triage vital signs and the nursing notes.                              Differential diagnosis includes, but is not limited to, ACS, aortic dissection, pulmonary embolism, cardiac tamponade, pneumothorax, pneumonia, pericarditis, myocarditis, GI-related causes including esophagitis/gastritis, and musculoskeletal chest wall pain.     Patient's presentation is most consistent with acute complicated illness / injury requiring diagnostic workup.  Patient's diagnosis is consistent with nonspecific chest pain.  Patient presents to the ED with nearly resolved symptoms of central chest pain with some referral of the left jaw.  Patient symptoms began yesterday, and continued today.  She denies any diaphoresis, shortness of breath, or cough.  Overall her workup is reassuring.  No signs of ACS.  Her labs are overall  reassuring with troponin flat x 2.  No EKG evidence of malignant arrhythmia.  No chest x-ray evidence of any intrathoracic process.  Patient on exam was without signs of acute distress.  Patient will be discharged home with instructions to take her home meds. Patient is to follow up with her PCP as needed or otherwise directed. Patient is given ED precautions to return to the ED for any worsening or new symptoms.   FINAL CLINICAL IMPRESSION(S) / ED DIAGNOSES   Final diagnoses:  Nonspecific chest pain     Rx / DC Orders   ED Discharge Orders     None        Note:  This document was prepared using Dragon voice recognition software and may include unintentional dictation errors.    Loyd Candida LULLA Aldona, PA-C 01/15/24 1921    Nicholaus Rolland BRAVO, MD 01/15/24 2352

## 2024-01-15 NOTE — Discharge Instructions (Signed)
 Your exam, labs, EKG, and chest XR are normal and reassuring. No signs of a heart attack, pneumonia, or blood clots. You should follow-up with your primary provider as needed. Continue to take your home medicines as prescribed.

## 2024-01-15 NOTE — ED Triage Notes (Addendum)
 BIB by [facility]> C/O chest pains x 1 day.  From home.  No current complaints of pain. 12 lean WNL per EMS. VS wnl.  324 baby ASA given.
